# Patient Record
Sex: Female | Born: 1975 | Race: Black or African American | Hispanic: No | Marital: Single | Smoking: Never smoker
Health system: Southern US, Community
[De-identification: ages and names within clinical notes are randomized; demographics above are authoritative.]

## PROBLEM LIST (undated history)

## (undated) DIAGNOSIS — E669 Obesity, unspecified: Secondary | ICD-10-CM

## (undated) DIAGNOSIS — K649 Unspecified hemorrhoids: Secondary | ICD-10-CM

## (undated) DIAGNOSIS — I10 Essential (primary) hypertension: Secondary | ICD-10-CM

## (undated) DIAGNOSIS — Z803 Family history of malignant neoplasm of breast: Secondary | ICD-10-CM

## (undated) DIAGNOSIS — R87629 Unspecified abnormal cytological findings in specimens from vagina: Secondary | ICD-10-CM

## (undated) DIAGNOSIS — N83209 Unspecified ovarian cyst, unspecified side: Secondary | ICD-10-CM

## (undated) HISTORY — DX: Family history of malignant neoplasm of breast: Z80.3

## (undated) HISTORY — DX: Unspecified ovarian cyst, unspecified side: N83.209

## (undated) HISTORY — DX: Essential (primary) hypertension: I10

## (undated) HISTORY — PX: WRIST SURGERY: SHX841

## (undated) HISTORY — DX: Obesity, unspecified: E66.9

## (undated) HISTORY — DX: Unspecified hemorrhoids: K64.9

## (undated) HISTORY — DX: Unspecified abnormal cytological findings in specimens from vagina: R87.629

---

## 2000-06-28 ENCOUNTER — Emergency Department (HOSPITAL_COMMUNITY): Admission: EM | Admit: 2000-06-28 | Discharge: 2000-06-28 | Payer: Self-pay | Admitting: Emergency Medicine

## 2000-06-28 ENCOUNTER — Encounter: Payer: Self-pay | Admitting: Emergency Medicine

## 2000-10-02 ENCOUNTER — Ambulatory Visit (HOSPITAL_BASED_OUTPATIENT_CLINIC_OR_DEPARTMENT_OTHER): Admission: RE | Admit: 2000-10-02 | Discharge: 2000-10-02 | Payer: Self-pay | Admitting: Orthopedic Surgery

## 2001-01-31 ENCOUNTER — Encounter: Payer: Self-pay | Admitting: Orthopedic Surgery

## 2001-01-31 ENCOUNTER — Ambulatory Visit (HOSPITAL_COMMUNITY): Admission: RE | Admit: 2001-01-31 | Discharge: 2001-01-31 | Payer: Self-pay | Admitting: Orthopedic Surgery

## 2001-02-25 ENCOUNTER — Ambulatory Visit (HOSPITAL_BASED_OUTPATIENT_CLINIC_OR_DEPARTMENT_OTHER): Admission: RE | Admit: 2001-02-25 | Discharge: 2001-02-25 | Payer: Self-pay | Admitting: Orthopedic Surgery

## 2010-09-11 ENCOUNTER — Encounter: Payer: Self-pay | Admitting: Maternal & Fetal Medicine

## 2010-10-09 ENCOUNTER — Encounter: Payer: Self-pay | Admitting: Maternal and Fetal Medicine

## 2010-10-12 ENCOUNTER — Observation Stay: Payer: Self-pay | Admitting: Obstetrics and Gynecology

## 2010-10-16 ENCOUNTER — Encounter: Payer: Self-pay | Admitting: Maternal and Fetal Medicine

## 2010-10-19 ENCOUNTER — Observation Stay: Payer: Self-pay | Admitting: Obstetrics and Gynecology

## 2010-10-26 ENCOUNTER — Observation Stay: Payer: Self-pay | Admitting: Obstetrics and Gynecology

## 2010-11-02 ENCOUNTER — Observation Stay: Payer: Self-pay | Admitting: Obstetrics and Gynecology

## 2010-11-06 ENCOUNTER — Encounter: Payer: Self-pay | Admitting: Maternal and Fetal Medicine

## 2010-11-09 ENCOUNTER — Observation Stay: Payer: Self-pay | Admitting: Obstetrics and Gynecology

## 2010-11-13 ENCOUNTER — Encounter: Payer: Self-pay | Admitting: Obstetrics and Gynecology

## 2010-11-16 ENCOUNTER — Observation Stay: Payer: Self-pay | Admitting: Obstetrics and Gynecology

## 2010-11-20 ENCOUNTER — Encounter: Payer: Self-pay | Admitting: Obstetrics & Gynecology

## 2010-11-23 ENCOUNTER — Observation Stay: Payer: Self-pay | Admitting: Obstetrics and Gynecology

## 2010-11-26 ENCOUNTER — Inpatient Hospital Stay: Payer: Self-pay | Admitting: Obstetrics and Gynecology

## 2011-10-16 HISTORY — PX: COLPOSCOPY W/ BIOPSY / CURETTAGE: SUR283

## 2012-01-23 ENCOUNTER — Ambulatory Visit: Payer: Self-pay | Admitting: Family Medicine

## 2012-02-16 ENCOUNTER — Ambulatory Visit: Payer: Self-pay | Admitting: Family Medicine

## 2012-10-08 ENCOUNTER — Ambulatory Visit: Payer: Self-pay | Admitting: Obstetrics and Gynecology

## 2013-04-08 LAB — HM PAP SMEAR: HM Pap smear: NEGATIVE

## 2014-10-19 ENCOUNTER — Encounter: Payer: Self-pay | Admitting: Obstetrics and Gynecology

## 2014-11-25 ENCOUNTER — Encounter: Payer: Self-pay | Admitting: Obstetrics and Gynecology

## 2015-01-06 ENCOUNTER — Ambulatory Visit (INDEPENDENT_AMBULATORY_CARE_PROVIDER_SITE_OTHER): Payer: 59 | Admitting: Obstetrics and Gynecology

## 2015-01-06 ENCOUNTER — Encounter: Payer: Self-pay | Admitting: Obstetrics and Gynecology

## 2015-01-06 VITALS — BP 139/83 | HR 89 | Ht 67.0 in | Wt 398.6 lb

## 2015-01-06 DIAGNOSIS — Z01419 Encounter for gynecological examination (general) (routine) without abnormal findings: Secondary | ICD-10-CM

## 2015-01-06 DIAGNOSIS — Z6841 Body Mass Index (BMI) 40.0 and over, adult: Secondary | ICD-10-CM | POA: Diagnosis not present

## 2015-01-06 DIAGNOSIS — I10 Essential (primary) hypertension: Secondary | ICD-10-CM | POA: Insufficient documentation

## 2015-01-06 DIAGNOSIS — Z0000 Encounter for general adult medical examination without abnormal findings: Secondary | ICD-10-CM

## 2015-01-06 NOTE — Patient Instructions (Signed)
1.  No Pap smear this year. 2.  Mirena IUD for contraception; we'll need to replace next year. 3.  Strongly encouraged healthy eating, exercise, and weight loss. 4.  Return in 1 year for annual exam.

## 2015-01-06 NOTE — Progress Notes (Signed)
Patient ID: Charlotte PeruBelinda K Carano, female   DOB: 09-Mar-1975, 39 y.o.   MRN: [id number] ANNUAL PREVENTATIVE CARE GYN  ENCOUNTER NOTE  Subjective:       Charlotte Burgess is a 39 y.o. 571P1001 female here for a routine annual gynecologic exam.  Current complaints: 1.  none    Gynecologic History Patient's last menstrual period was 12/10/2014 (exact date). Contraception: IUD ; replaced in 2017 Last Pap: 03/2013 neg/neg. Last mammogram: n/a.  Obstetric History OB History  Gravida Para Term Preterm AB SAB TAB Ectopic Multiple Living  1 1 1       1     # Outcome Date GA Lbr Len/2nd Weight Sex Delivery Anes PTL Lv  1 Term      Vag-Spont   Y     Complications: Chronic hypertension,Obesity      Past Medical History  Diagnosis Date  . Hemorrhoids   . Vaginal Pap smear, abnormal     ascus/pos  . Hypertension     chronic  . Obesity   . Ovarian cyst   . Family history of breast cancer     Past Surgical History  Procedure Laterality Date  . Colposcopy w/ biopsy / curettage  10/2011    ecc- neg cin 1 on bx  . Wrist surgery Left     Current Outpatient Prescriptions on File Prior to Visit  Medication Sig Dispense Refill  . hydrochlorothiazide (HYDRODIURIL) 25 MG tablet Take 25 mg by mouth daily.    Marland Kitchen. levonorgestrel (MIRENA) 20 MCG/24HR IUD 1 each by Intrauterine route once.     No current facility-administered medications on file prior to visit.    No Known Allergies  Social History   Social History  . Marital Status: Single    Spouse Name: N/A  . Number of Children: N/A  . Years of Education: N/A   Occupational History  . Not on file.   Social History Main Topics  . Smoking status: Never Smoker   . Smokeless tobacco: Not on file  . Alcohol Use: Yes     Comment: occas  . Drug Use: No  . Sexual Activity: Yes    Birth Control/ Protection: IUD   Other Topics Concern  . Not on file   Social History Narrative    Family History  Problem Relation Age of Onset  . Heart  disease Mother   . Heart disease Father   . Diabetes Father   . Colon cancer Maternal Aunt   . Ovarian cancer Maternal Aunt   . Breast cancer Maternal Aunt   . Breast cancer Maternal Grandmother     The following portions of the patient's history were reviewed and updated as appropriate: allergies, current medications, past family history, past medical history, past social history, past surgical history and problem list.  Review of Systems ROS Review of Systems - General ROS: negative for - chills, fatigue, fever, hot flashes, night sweats, weight gain or weight loss Psychological ROS: negative for - anxiety, decreased libido, depression, mood swings, physical abuse or sexual abuse Ophthalmic ROS: negative for - blurry vision, eye pain or loss of vision ENT ROS: negative for - headaches, hearing change, visual changes or vocal changes Allergy and Immunology ROS: negative for - hives, itchy/watery eyes or seasonal allergies Hematological and Lymphatic ROS: negative for - bleeding problems, bruising, swollen lymph nodes or weight loss Endocrine ROS: negative for - galactorrhea, hair pattern changes, hot flashes, malaise/lethargy, mood swings, palpitations, polydipsia/polyuria, skin changes, temperature intolerance or unexpected  weight changes Breast ROS: negative for - new or changing breast lumps or nipple discharge Respiratory ROS: negative for - cough or shortness of breath Cardiovascular ROS: negative for - chest pain, irregular heartbeat, palpitations or shortness of breath Gastrointestinal ROS: no abdominal pain, change in bowel habits, or black or bloody stools Genito-Urinary ROS: no dysuria, trouble voiding, or hematuria Musculoskeletal ROS: negative for - joint pain or joint stiffness Neurological ROS: negative for - bowel and bladder control changes Dermatological ROS: negative for rash and skin lesion changes   Objective:   BP 139/83 mmHg  Pulse 89  Ht  (1.702 m)  Wt  398 lb 9.6 oz (180.804 kg)  BMI 62.41 kg/m2  LMP 12/10/2014 (Exact Date) CONSTITUTIONAL: Well-developed, well-nourished female in no acute distress.  PSYCHIATRIC: Normal mood and affect. Normal behavior. Normal judgment and thought content. NEUROLGIC: Alert and oriented to person, place, and time. Normal muscle tone coordination. No cranial nerve deficit noted. HENT:  Normocephalic, atraumatic, External right and left ear normal. Oropharynx is clear and moist EYES: Conjunctivae and EOM are normal. Pupils are equal, round, and reactive to light. No scleral icterus.  NECK: Normal range of motion, supple, no masses.  Normal thyroid.  SKIN: Skin is warm and dry. No rash noted. Not diaphoretic. No erythema. No pallor. CARDIOVASCULAR: Normal heart rate noted, regular rhythm, no murmur. RESPIRATORY: Clear to auscultation bilaterally. Effort and breath sounds normal, no problems with respiration noted. BREASTS: Symmetric in size. No masses, skin changes, nipple drainage, or lymphadenopathy. ABDOMEN: Soft, normal bowel sounds, no distention noted.  No tenderness, rebound or guarding.  BLADDER: Normal PELVIC:  External Genitalia: Normal  BUS: Normal  Vagina: Normal  Cervix: Normal; IUD string 3 cm  Uterus: Normal; nonenlarged  Adnexa: Normal  RV: External Exam NormaI  MUSCULOSKELETAL: Normal range of motion. No tenderness.  No cyanosis, clubbing, or edema.  2+ distal pulses. LYMPHATIC: No Axillary, Supraclavicular, or Inguinal Adenopathy.    Assessment:   Annual gynecologic examination 39 y.o. Contraception: IUD [id number] Chronic hypertension, stable, managed by Dr. Burnadette Pop  Plan:  Pap: Not needed Mammogram: Not Indicated Stool Guaiac Testing:  Not Indicated Labs: thru pcp Routine preventative health maintenance measures emphasized: Exercise/Diet/Weight control, Tobacco Warnings, Alcohol/Substance use risks and Safe Sex Return to Clinic - 1 Year   Crystal [place], [place]  Herold Harms, MD  Note: This dictation was prepared with Dragon dictation along with smaller phrase technology. Any transcriptional errors that result from this process are unintentional.

## 2016-01-10 ENCOUNTER — Encounter: Payer: 59 | Admitting: Obstetrics and Gynecology

## 2016-01-11 ENCOUNTER — Encounter: Payer: 59 | Admitting: Obstetrics and Gynecology

## 2016-01-12 NOTE — Progress Notes (Signed)
Patient ID: Charlotte PeruBelinda K Joswick, female   DOB: 1975-11-18, 40 y.o.   MRN: [id number] ANNUAL PREVENTATIVE CARE GYN  ENCOUNTER NOTE  Subjective:       Charlotte Burgess is a 40 y.o. 31P1001 female here for a routine annual gynecologic exam.  Current complaints: 1.  REMOVE MIRENA  Patient has lost 14 pounds in the past year. Cycles are light. Bowel function is normal. Bladder function is normal. Screening labs are obtained through primary care.   Gynecologic History No LMP recorded. Contraception: IUD ; replaced in 2017 Last Pap: 03/2013 neg/neg. Last mammogram: n/a.  Obstetric History OB History  Gravida Para Term Preterm AB Living  1 1 1     1   SAB TAB Ectopic Multiple Live Births          1    # Outcome Date GA Lbr Len/2nd Weight Sex Delivery Anes PTL Lv  1 Term      Vag-Spont   LIV     Complications: Chronic hypertension,Obesity      Past Medical History:  Diagnosis Date  . Family history of breast cancer   . Hemorrhoids   . Hypertension    chronic  . Obesity   . Ovarian cyst   . Vaginal Pap smear, abnormal    ascus/pos    Past Surgical History:  Procedure Laterality Date  . COLPOSCOPY W/ BIOPSY / CURETTAGE  10/2011   ecc- neg cin 1 on bx  . WRIST SURGERY Left     Current Outpatient Prescriptions on File Prior to Visit  Medication Sig Dispense Refill  . hydrochlorothiazide (HYDRODIURIL) 25 MG tablet Take 25 mg by mouth daily.    Marland Kitchen. levonorgestrel (MIRENA) 20 MCG/24HR IUD 1 each by Intrauterine route once.    Marland Kitchen. losartan (COZAAR) 50 MG tablet Take by mouth.    . Multiple Vitamin (MULTI-VITAMINS) TABS Take by mouth.     No current facility-administered medications on file prior to visit.     No Known Allergies  Social History   Social History  . Marital status: Single    Spouse name: N/A  . Number of children: N/A  . Years of education: N/A   Occupational History  . Not on file.   Social History Main Topics  . Smoking status: Never Smoker  .  Smokeless tobacco: Not on file  . Alcohol use Yes     Comment: occas  . Drug use: No  . Sexual activity: Yes    Birth control/ protection: IUD   Other Topics Concern  . Not on file   Social History Narrative  . No narrative on file    Family History  Problem Relation Age of Onset  . Heart disease Mother   . Heart disease Father   . Diabetes Father   . Colon cancer Maternal Aunt   . Ovarian cancer Maternal Aunt   . Breast cancer Maternal Aunt   . Breast cancer Maternal Grandmother     The following portions of the patient's history were reviewed and updated as appropriate: allergies, current medications, past family history, past medical history, past social history, past surgical history and problem list.  Review of Systems ROS Review of Systems - General ROS: negative for - chills, fatigue, fever, hot flashes, night sweats, weight gain or weight loss Psychological ROS: negative for - anxiety, decreased libido, depression, mood swings, physical abuse or sexual abuse Ophthalmic ROS: negative for - blurry vision, eye pain or loss of vision ENT ROS: negative for -  headaches, hearing change, visual changes or vocal changes Allergy and Immunology ROS: negative for - hives, itchy/watery eyes or seasonal allergies Hematological and Lymphatic ROS: negative for - bleeding problems, bruising, swollen lymph nodes or weight loss Endocrine ROS: negative for - galactorrhea, hair pattern changes, hot flashes, malaise/lethargy, mood swings, palpitations, polydipsia/polyuria, skin changes, temperature intolerance or unexpected weight changes Breast ROS: negative for - new or changing breast lumps or nipple discharge Respiratory ROS: negative for - cough or shortness of breath Cardiovascular ROS: negative for - chest pain, irregular heartbeat, palpitations or shortness of breath Gastrointestinal ROS: no abdominal pain, change in bowel habits, or black or bloody stools Genito-Urinary ROS: no  dysuria, trouble voiding, or hematuria Musculoskeletal ROS: negative for - joint pain or joint stiffness Neurological ROS: negative for - bowel and bladder control changes Dermatological ROS: negative for rash and skin lesion changes   Objective:    BP (!) 141/89   Pulse 90   Ht 5\' 7"  (1.702 m)   Wt (!) 384 lb 8 oz (174.4 kg)   LMP 01/04/2016 (Approximate)   BMI 60.22 kg/m   CONSTITUTIONAL: Well-developed, well-nourished female in no acute distress.  PSYCHIATRIC: Normal mood and affect. Normal behavior. Normal judgment and thought content. NEUROLGIC: Alert and oriented to person, place, and time. Normal muscle tone coordination. No cranial nerve deficit noted. HENT:  Normocephalic, atraumatic, External right and left ear normal. Oropharynx is clear and moist EYES: Conjunctivae and EOM are normal. Pupils are equal, round, and reactive to light. No scleral icterus.  NECK: Normal range of motion, supple, no masses.  Normal thyroid.  SKIN: Skin is warm and dry. No rash noted. Not diaphoretic. No erythema. No pallor. CARDIOVASCULAR: Normal heart rate noted, regular rhythm, no murmur. RESPIRATORY: Clear to auscultation bilaterally. Effort and breath sounds normal, no problems with respiration noted. BREASTS: Symmetric in size. No masses, skin changes, nipple drainage, or lymphadenopathy. ABDOMEN: Soft, normal bowel sounds, no distention noted.  No tenderness, rebound or guarding.  BLADDER: Normal PELVIC:  External Genitalia: Normal  BUS: Normal  Vagina: Normal  Cervix: Normal; IUD string 3 cm, removed with [place] forceps today  Uterus: Normal; nonenlarged; midplane; nontender  Adnexa: Normal; nonpalpable and nontender  RV: External Exam NormaI  MUSCULOSKELETAL: Normal range of motion. No tenderness.  No cyanosis, clubbing, or edema.  2+ distal pulses. LYMPHATIC: No Axillary, Supraclavicular, or Inguinal Adenopathy.  PROCEDURE: IUD removal Consent is obtained. IUD string is grasped  with [place] forceps and IUD is removed. No complications  Assessment:   Annual gynecologic examination 40 y.o. Contraception: IUD BMI- 60 Chronic hypertension, stable, managed by Dr. Burnadette PopLinthavong  Plan:  Pap: PAP/HPV Mammogram: due age 40- ORDERED Stool Guaiac Testing:  Not Indicated Labs: thru pcp Routine preventative health maintenance measures emphasized: Exercise/Diet/Weight control, Tobacco Warnings, Alcohol/Substance use risks and Safe Sex  IUD is removed Return to Clinic - 1 Year   Marlise Fahr BasinMiller, CMA  Herold HarmsMartin A Defrancesco, MD    Note: This dictation was prepared with Dragon dictation along with smaller phrase technology. Any transcriptional errors that result from this process are unintentional.

## 2016-01-18 ENCOUNTER — Encounter: Payer: Self-pay | Admitting: Obstetrics and Gynecology

## 2016-01-18 ENCOUNTER — Ambulatory Visit (INDEPENDENT_AMBULATORY_CARE_PROVIDER_SITE_OTHER): Payer: 59 | Admitting: Obstetrics and Gynecology

## 2016-01-18 VITALS — BP 141/89 | HR 90 | Ht 67.0 in | Wt 384.5 lb

## 2016-01-18 DIAGNOSIS — Z6841 Body Mass Index (BMI) 40.0 and over, adult: Secondary | ICD-10-CM | POA: Diagnosis not present

## 2016-01-18 DIAGNOSIS — Z01419 Encounter for gynecological examination (general) (routine) without abnormal findings: Secondary | ICD-10-CM | POA: Diagnosis not present

## 2016-01-18 DIAGNOSIS — Z1239 Encounter for other screening for malignant neoplasm of breast: Secondary | ICD-10-CM

## 2016-01-18 DIAGNOSIS — Z30432 Encounter for removal of intrauterine contraceptive device: Secondary | ICD-10-CM

## 2016-01-18 DIAGNOSIS — Z1231 Encounter for screening mammogram for malignant neoplasm of breast: Secondary | ICD-10-CM

## 2016-01-18 NOTE — Patient Instructions (Signed)
1. Pap smear is done 2. Mammogram is ordered 3. Continue with healthy eating and exercise with controlled weight loss 4. Screening labs are to be done by primary care 5. Return in 1 year   Health Maintenance, Female Introduction Adopting a healthy lifestyle and getting preventive care can go a long way to promote health and wellness. Talk with your health care provider about what schedule of regular examinations is right for you. This is a good chance for you to check in with your provider about disease prevention and staying healthy. In between checkups, there are plenty of things you can do on your own. Experts have done a lot of research about which lifestyle changes and preventive measures are most likely to keep you healthy. Ask your health care provider for more information. Weight and diet Eat a healthy diet  Be sure to include plenty of vegetables, fruits, low-fat dairy products, and lean protein.  Do not eat a lot of foods high in solid fats, added sugars, or salt.  Get regular exercise. This is one of the most important things you can do for your health.  Most adults should exercise for at least 150 minutes each week. The exercise should increase your heart rate and make you sweat (moderate-intensity exercise).  Most adults should also do strengthening exercises at least twice a week. This is in addition to the moderate-intensity exercise. Maintain a healthy weight  Body mass index (BMI) is a measurement that can be used to identify possible weight problems. It estimates body fat based on height and weight. Your health care provider can help determine your BMI and help you achieve or maintain a healthy weight.  For females 76 years of age and older:  A BMI below 18.5 is considered underweight.  A BMI of 18.5 to 24.9 is normal.  A BMI of 25 to 29.9 is considered overweight.  A BMI of 30 and above is considered obese. Watch levels of cholesterol and blood lipids  You  should start having your blood tested for lipids and cholesterol at 40 years of age, then have this test every 5 years.  You may need to have your cholesterol levels checked more often if:  Your lipid or cholesterol levels are high.  You are older than 40 years of age.  You are at high risk for heart disease. Cancer screening Lung Cancer  Lung cancer screening is recommended for adults 47-31 years old who are at high risk for lung cancer because of a history of smoking.  A yearly low-dose CT scan of the lungs is recommended for people who:  Currently smoke.  Have quit within the past 15 years.  Have at least a 30-pack-year history of smoking. A pack year is smoking an average of one pack of cigarettes a day for 1 year.  Yearly screening should continue until it has been 15 years since you quit.  Yearly screening should stop if you develop a health problem that would prevent you from having lung cancer treatment. Breast Cancer  Practice breast self-awareness. This means understanding how your breasts normally appear and feel.  It also means doing regular breast self-exams. Let your health care provider know about any changes, no matter how small.  If you are in your 20s or 30s, you should have a clinical breast exam (CBE) by a health care provider every 1-3 years as part of a regular health exam.  If you are 70 or older, have a CBE every year. Also  consider having a breast X-ray (mammogram) every year.  If you have a family history of breast cancer, talk to your health care provider about genetic screening.  If you are at high risk for breast cancer, talk to your health care provider about having an MRI and a mammogram every year.  Breast cancer gene (BRCA) assessment is recommended for women who have family members with BRCA-related cancers. BRCA-related cancers include:  Breast.  Ovarian.  Tubal.  Peritoneal cancers.  Results of the assessment will determine the need  for genetic counseling and BRCA1 and BRCA2 testing. Cervical Cancer  Your health care provider may recommend that you be screened regularly for cancer of the pelvic organs (ovaries, uterus, and vagina). This screening involves a pelvic examination, including checking for microscopic changes to the surface of your cervix (Pap test). You may be encouraged to have this screening done every 3 years, beginning at age 47.  For women ages 33-65, health care providers may recommend pelvic exams and Pap testing every 3 years, or they may recommend the Pap and pelvic exam, combined with testing for human papilloma virus (HPV), every 5 years. Some types of HPV increase your risk of cervical cancer. Testing for HPV may also be done on women of any age with unclear Pap test results.  Other health care providers may not recommend any screening for nonpregnant women who are considered low risk for pelvic cancer and who do not have symptoms. Ask your health care provider if a screening pelvic exam is right for you.  If you have had past treatment for cervical cancer or a condition that could lead to cancer, you need Pap tests and screening for cancer for at least 20 years after your treatment. If Pap tests have been discontinued, your risk factors (such as having a new sexual partner) need to be reassessed to determine if screening should resume. Some women have medical problems that increase the chance of getting cervical cancer. In these cases, your health care provider may recommend more frequent screening and Pap tests. Colorectal Cancer  This type of cancer can be detected and often prevented.  Routine colorectal cancer screening usually begins at 40 years of age and continues through 40 years of age.  Your health care provider may recommend screening at an earlier age if you have risk factors for colon cancer.  Your health care provider may also recommend using home test kits to check for hidden blood in the  stool.  A small camera at the end of a tube can be used to examine your colon directly (sigmoidoscopy or colonoscopy). This is done to check for the earliest forms of colorectal cancer.  Routine screening usually begins at age 43.  Direct examination of the colon should be repeated every 5-10 years through 40 years of age. However, you may need to be screened more often if early forms of precancerous polyps or small growths are found. Skin Cancer  Check your skin from head to toe regularly.  Tell your health care provider about any new moles or changes in moles, especially if there is a change in a mole's shape or color.  Also tell your health care provider if you have a mole that is larger than the size of a pencil eraser.  Always use sunscreen. Apply sunscreen liberally and repeatedly throughout the day.  Protect yourself by wearing long sleeves, pants, a wide-brimmed hat, and sunglasses whenever you are outside. Heart disease, diabetes, and high blood pressure  High  blood pressure causes heart disease and increases the risk of stroke. High blood pressure is more likely to develop in:  People who have blood pressure in the high end of the normal range (130-139/85-89 mm Hg).  People who are overweight or obese.  People who are African American.  If you are 74-57 years of age, have your blood pressure checked every 3-5 years. If you are 50 years of age or older, have your blood pressure checked every year. You should have your blood pressure measured twice-once when you are at a hospital or clinic, and once when you are not at a hospital or clinic. Record the average of the two measurements. To check your blood pressure when you are not at a hospital or clinic, you can use:  An automated blood pressure machine at a pharmacy.  A home blood pressure monitor.  If you are between 68 years and 71 years old, ask your health care provider if you should take aspirin to prevent  strokes.  Have regular diabetes screenings. This involves taking a blood sample to check your fasting blood sugar level.  If you are at a normal weight and have a low risk for diabetes, have this test once every three years after 40 years of age.  If you are overweight and have a high risk for diabetes, consider being tested at a younger age or more often. Preventing infection Hepatitis B  If you have a higher risk for hepatitis B, you should be screened for this virus. You are considered at high risk for hepatitis B if:  You were born in a country where hepatitis B is common. Ask your health care provider which countries are considered high risk.  Your parents were born in a high-risk country, and you have not been immunized against hepatitis B (hepatitis B vaccine).  You have HIV or AIDS.  You use needles to inject street drugs.  You live with someone who has hepatitis B.  You have had sex with someone who has hepatitis B.  You get hemodialysis treatment.  You take certain medicines for conditions, including cancer, organ transplantation, and autoimmune conditions. Hepatitis C  Blood testing is recommended for:  Everyone born from [birth year] through 1964.  Anyone with known risk factors for hepatitis C. Sexually transmitted infections (STIs)  You should be screened for sexually transmitted infections (STIs) including gonorrhea and chlamydia if:  You are sexually active and are younger than 40 years of age.  You are older than 40 years of age and your health care provider tells you that you are at risk for this type of infection.  Your sexual activity has changed since you were last screened and you are at an increased risk for chlamydia or gonorrhea. Ask your health care provider if you are at risk.  If you do not have HIV, but are at risk, it may be recommended that you take a prescription medicine daily to prevent HIV infection. This is called pre-exposure prophylaxis  (PrEP). You are considered at risk if:  You are sexually active and do not regularly use condoms or know the HIV status of your partner(s).  You take drugs by injection.  You are sexually active with a partner who has HIV. Talk with your health care provider about whether you are at high risk of being infected with HIV. If you choose to begin PrEP, you should first be tested for HIV. You should then be tested every 3 months for as long as  you are taking PrEP. Pregnancy  If you are premenopausal and you may become pregnant, ask your health care provider about preconception counseling.  If you may become pregnant, take 400 to 800 micrograms (mcg) of folic acid every day.  If you want to prevent pregnancy, talk to your health care provider about birth control (contraception). Osteoporosis and menopause  Osteoporosis is a disease in which the bones lose minerals and strength with aging. This can result in serious bone fractures. Your risk for osteoporosis can be identified using a bone density scan.  If you are 65 years of age or older, or if you are at risk for osteoporosis and fractures, ask your health care provider if you should be screened.  Ask your health care provider whether you should take a calcium or vitamin D supplement to lower your risk for osteoporosis.  Menopause may have certain physical symptoms and risks.  Hormone replacement therapy may reduce some of these symptoms and risks. Talk to your health care provider about whether hormone replacement therapy is right for you. Follow these instructions at home:  Schedule regular health, dental, and eye exams.  Stay current with your immunizations.  Do not use any tobacco products including cigarettes, chewing tobacco, or electronic cigarettes.  If you are pregnant, do not drink alcohol.  If you are breastfeeding, limit how much and how often you drink alcohol.  Limit alcohol intake to no more than 1 drink per day for  nonpregnant women. One drink equals 12 ounces of beer, 5 ounces of wine, or 1 ounces of hard liquor.  Do not use street drugs.  Do not share needles.  Ask your health care provider for help if you need support or information about quitting drugs.  Tell your health care provider if you often feel depressed.  Tell your health care provider if you have ever been abused or do not feel safe at home. This information is not intended to replace advice given to you by your health care provider. Make sure you discuss any questions you have with your health care provider. Document Released: 07/17/2010 Document Revised: 06/09/2015 Document Reviewed: 10/05/2014  2017 [employer]vier

## 2016-01-20 LAB — PAP IG AND HPV HIGH-RISK
HPV, high-risk: NEGATIVE
PAP Smear Comment: 0

## 2016-03-09 ENCOUNTER — Ambulatory Visit
Admission: RE | Admit: 2016-03-09 | Discharge: 2016-03-09 | Disposition: A | Discharge status: Home / Self Care | Payer: 59 | Source: Ambulatory Visit | Attending: Obstetrics and Gynecology | Admitting: Obstetrics and Gynecology

## 2016-03-09 DIAGNOSIS — Z1231 Encounter for screening mammogram for malignant neoplasm of breast: Secondary | ICD-10-CM | POA: Diagnosis present

## 2016-03-09 DIAGNOSIS — Z1239 Encounter for other screening for malignant neoplasm of breast: Secondary | ICD-10-CM

## 2016-03-19 DIAGNOSIS — Z0000 Encounter for general adult medical examination without abnormal findings: Secondary | ICD-10-CM | POA: Diagnosis not present

## 2016-03-26 DIAGNOSIS — I10 Essential (primary) hypertension: Secondary | ICD-10-CM | POA: Diagnosis not present

## 2016-03-26 DIAGNOSIS — Z0000 Encounter for general adult medical examination without abnormal findings: Secondary | ICD-10-CM | POA: Diagnosis not present

## 2016-09-25 DIAGNOSIS — I10 Essential (primary) hypertension: Secondary | ICD-10-CM | POA: Diagnosis not present

## 2016-09-27 DIAGNOSIS — I10 Essential (primary) hypertension: Secondary | ICD-10-CM | POA: Diagnosis not present

## 2017-01-21 NOTE — Progress Notes (Signed)
Patient ID: Charlotte Burgess, female   DOB: 1975/01/17, 42 y.o.   MRN: [id number] ANNUAL PREVENTATIVE CARE GYN  ENCOUNTER NOTE  Subjective:       Charlotte PeruBelinda K Gruenhagen is a 42 y.o. 521P1001 female here for a routine annual gynecologic exam.  Current complaints: 1.  none   Cycles are regular- med flow. Bowel function is normal. Bladder function is normal. Screening labs are obtained through primary care. Patient notes inframammary rash, pruritic Patient is not sexually active with intercourse   Gynecologic History LMP- 12/18/2016 Contraception: none Last Pap: 01/18/2016 neg/neg. Last mammogram: 03/09/2016 birad 1  Obstetric History OB History  Gravida Para Term Preterm AB Living  1 1 1     1   SAB TAB Ectopic Multiple Live Births          1    # Outcome Date GA Lbr Len/2nd Weight Sex Delivery Anes PTL Lv  1 Term      Vag-Spont   LIV     Complications: Chronic hypertension,Obesity      Past Medical History:  Diagnosis Date  . Family history of breast cancer   . Hemorrhoids   . Hypertension    chronic  . Obesity   . Ovarian cyst   . Vaginal Pap smear, abnormal    ascus/pos    Past Surgical History:  Procedure Laterality Date  . COLPOSCOPY W/ BIOPSY / CURETTAGE  10/2011   ecc- neg cin 1 on bx  . WRIST SURGERY Left     Current Outpatient Medications on File Prior to Visit  Medication Sig Dispense Refill  . hydrochlorothiazide (HYDRODIURIL) 25 MG tablet Take 25 mg by mouth daily.    Marland Kitchen. levonorgestrel (MIRENA) 20 MCG/24HR IUD 1 each by Intrauterine route once.    Marland Kitchen. losartan (COZAAR) 50 MG tablet Take by mouth.    . Multiple Vitamin (MULTI-VITAMINS) TABS Take by mouth.     No current facility-administered medications on file prior to visit.     No Known Allergies  Social History   Socioeconomic History  . Marital status: Single    Spouse name: Not on file  . Number of children: Not on file  . Years of education: Not on file  . Highest education level: Not on file   Social Needs  . Financial resource strain: Not on file  . Food insecurity - worry: Not on file  . Food insecurity - inability: Not on file  . Transportation needs - medical: Not on file  . Transportation needs - non-medical: Not on file  Occupational History  . Not on file  Tobacco Use  . Smoking status: Never Smoker  . Smokeless tobacco: Never Used  Substance and Sexual Activity  . Alcohol use: Yes    Comment: occas  . Drug use: No  . Sexual activity: Yes    Birth control/protection: IUD  Other Topics Concern  . Not on file  Social History Narrative  . Not on file    Family History  Problem Relation Age of Onset  . Heart disease Mother   . Heart disease Father   . Diabetes Father   . Colon cancer Maternal Aunt   . Ovarian cancer Maternal Aunt   . Breast cancer Maternal Aunt   . Breast cancer Maternal Grandmother   . Breast cancer Maternal Aunt        not 100% sure    The following portions of the patient's history were reviewed and updated as appropriate: allergies, current medications,  past family history, past medical history, past social history, past surgical history and problem list.  Review of Systems Review of Systems  Constitutional: Negative.   HENT: Negative.   Eyes: Negative.   Respiratory: Negative.   Cardiovascular: Negative.   Gastrointestinal: Negative.   Genitourinary: Negative.   Musculoskeletal: Negative.   Skin: Positive for rash.       Inframammary rash consistent with monilia  Neurological: Negative.   Endo/Heme/Allergies: Negative.   Psychiatric/Behavioral: Negative.      Objective:    BP (!) 142/86   Pulse 87   Ht 5\' 7"  (1.702 m)   Wt (!) 398 lb 3.2 oz (180.6 kg)   LMP 12/18/2016 (Exact Date)   BMI 62.37 kg/m   CONSTITUTIONAL: Well-developed, well-nourished female in no acute distress.  PSYCHIATRIC: Normal mood and affect. Normal behavior. Normal judgment and thought content. NEUROLGIC: Alert and oriented to person, place,  and time. Normal muscle tone coordination. No cranial nerve deficit noted. HENT:  Normocephalic, atraumatic, External right and left ear normal. Oropharynx is clear and moist EYES: Conjunctivae and EOM are normal. No scleral icterus.  NECK: Normal range of motion, supple, no masses.  Normal thyroid.  SKIN: Skin is warm and dry. No rash noted. Not diaphoretic. No erythema. No pallor. CARDIOVASCULAR: Normal heart rate noted, regular rhythm, no murmur. RESPIRATORY: Clear to auscultation bilaterally. Effort and breath sounds normal, no problems with respiration noted. BREASTS: Symmetric in size. No masses, skin changes, nipple drainage, or lymphadenopathy.  Monilia type inframammary skin rashes noted bilaterally ABDOMEN: Soft, normal bowel sounds, no distention noted.  No tenderness, rebound or guarding.  BLADDER: Normal PELVIC: (Graves speculum-large)  External Genitalia: Normal  BUS: Normal  Vagina: Normal  Cervix: Normal; no lesions; normal cervical mucus seen  Uterus: Normal; nonenlarged; midplane; nontender (body habitus restricts ability to assess uterus)  Adnexa: Normal; nonpalpable and nontender  RV: External Exam NormaI, No Rectal Masses and Normal Sphincter tone  MUSCULOSKELETAL: Normal range of motion. No tenderness.  No cyanosis, clubbing, or edema.  2+ distal pulses. LYMPHATIC: No Axillary, Supraclavicular, or Inguinal Adenopathy.    Assessment:   Annual gynecologic examination 42 y.o. Contraception: none BMI- 62; weight increasing due to decreased physical activity Chronic hypertension, stable, managed by Dr. Burnadette Pop   Plan:  Pap: Due 2021 Mammogram:ordered Stool Guaiac Testing:  Not Indicated Labs: thru pcp Routine preventative health maintenance measures emphasized: Exercise/Diet/Weight control, Tobacco Warnings, Alcohol/Substance use risks and Safe Sex  Weight loss goal is 10-15 pounds in 1 year; encouraged 10,000 steps a day Nystatin/triamcinolone cream is to be  applied topically to the breast rash twice a day for 7-14 days Return to Clinic - 1 [address] [place], CMA  Herold Harms, MD  Note: This dictation was prepared with Dragon dictation along with smaller phrase technology. Any transcriptional errors that result from this process are unintentional.

## 2017-01-22 ENCOUNTER — Ambulatory Visit (INDEPENDENT_AMBULATORY_CARE_PROVIDER_SITE_OTHER): Payer: 59 | Admitting: Obstetrics and Gynecology

## 2017-01-22 ENCOUNTER — Encounter: Payer: Self-pay | Admitting: Obstetrics and Gynecology

## 2017-01-22 VITALS — BP 142/86 | HR 87 | Ht 67.0 in | Wt 398.2 lb

## 2017-01-22 DIAGNOSIS — Z1239 Encounter for other screening for malignant neoplasm of breast: Secondary | ICD-10-CM

## 2017-01-22 DIAGNOSIS — Z01419 Encounter for gynecological examination (general) (routine) without abnormal findings: Secondary | ICD-10-CM

## 2017-01-22 DIAGNOSIS — Z6841 Body Mass Index (BMI) 40.0 and over, adult: Secondary | ICD-10-CM

## 2017-01-22 DIAGNOSIS — Z1231 Encounter for screening mammogram for malignant neoplasm of breast: Secondary | ICD-10-CM

## 2017-01-22 DIAGNOSIS — B379 Candidiasis, unspecified: Secondary | ICD-10-CM | POA: Diagnosis not present

## 2017-01-22 MED ORDER — NYSTATIN-TRIAMCINOLONE 100000-0.1 UNIT/GM-% EX CREA: 1.0000 "application " | TOPICAL_CREAM | Freq: Two times a day (BID) | CUTANEOUS | 1 refills | Status: DC

## 2017-01-22 NOTE — Patient Instructions (Signed)
1.  No Pap smear is done.  Next Pap smear is due in 2021 2.  Mammogram is ordered 3.  Screening labs are to be obtained through primary care 4.  Continue with healthy eating and exercise with control weight loss.  Weight loss goal for the year is 15 pounds 5.  Return in 1 year for annual exam 6.  Nystatin/triamcinolone cream is to be applied topically to the rash twice a day for 10-14 days for management of his dermatitis  Health Maintenance, Female Adopting a healthy lifestyle and getting preventive care can go a long way to promote health and wellness. Talk with your health care provider about what schedule of regular examinations is right for you. This is a good chance for you to check in with your provider about disease prevention and staying healthy. In between checkups, there are plenty of things you can do on your own. Experts have done a lot of research about which lifestyle changes and preventive measures are most likely to keep you healthy. Ask your health care provider for more information. Weight and diet Eat a healthy diet  Be sure to include plenty of vegetables, fruits, low-fat dairy products, and lean protein.  Do not eat a lot of foods high in solid fats, added sugars, or salt.  Get regular exercise. This is one of the most important things you can do for your health. ? Most adults should exercise for at least 150 minutes each week. The exercise should increase your heart rate and make you sweat (moderate-intensity exercise). ? Most adults should also do strengthening exercises at least twice a week. This is in addition to the moderate-intensity exercise.  Maintain a healthy weight  Body mass index (BMI) is a measurement that can be used to identify possible weight problems. It estimates body fat based on height and weight. Your health care provider can help determine your BMI and help you achieve or maintain a healthy weight.  For females 75 years of age and older: ? A BMI  below 18.5 is considered underweight. ? A BMI of 18.5 to 24.9 is normal. ? A BMI of 25 to 29.9 is considered overweight. ? A BMI of 30 and above is considered obese.  Watch levels of cholesterol and blood lipids  You should start having your blood tested for lipids and cholesterol at 41 years of age, then have this test every 5 years.  You may need to have your cholesterol levels checked more often if: ? Your lipid or cholesterol levels are high. ? You are older than 41 years of age. ? You are at high risk for heart disease.  Cancer screening Lung Cancer  Lung cancer screening is recommended for adults 47-21 years old who are at high risk for lung cancer because of a history of smoking.  A yearly low-dose CT scan of the lungs is recommended for people who: ? Currently smoke. ? Have quit within the past 15 years. ? Have at least a 30-pack-year history of smoking. A pack year is smoking an average of one pack of cigarettes a day for 1 year.  Yearly screening should continue until it has been 15 years since you quit.  Yearly screening should stop if you develop a health problem that would prevent you from having lung cancer treatment.  Breast Cancer  Practice breast self-awareness. This means understanding how your breasts normally appear and feel.  It also means doing regular breast self-exams. Let your health care provider know  about any changes, no matter how small.  If you are in your 20s or 30s, you should have a clinical breast exam (CBE) by a health care provider every 1-3 years as part of a regular health exam.  If you are 45 or older, have a CBE every year. Also consider having a breast X-ray (mammogram) every year.  If you have a family history of breast cancer, talk to your health care provider about genetic screening.  If you are at high risk for breast cancer, talk to your health care provider about having an MRI and a mammogram every year.  Breast cancer gene  (BRCA) assessment is recommended for women who have family members with BRCA-related cancers. BRCA-related cancers include: ? Breast. ? Ovarian. ? Tubal. ? Peritoneal cancers.  Results of the assessment will determine the need for genetic counseling and BRCA1 and BRCA2 testing.  Cervical Cancer Your health care provider may recommend that you be screened regularly for cancer of the pelvic organs (ovaries, uterus, and vagina). This screening involves a pelvic examination, including checking for microscopic changes to the surface of your cervix (Pap test). You may be encouraged to have this screening done every 3 years, beginning at age 50.  For women ages 49-65, health care providers may recommend pelvic exams and Pap testing every 3 years, or they may recommend the Pap and pelvic exam, combined with testing for human papilloma virus (HPV), every 5 years. Some types of HPV increase your risk of cervical cancer. Testing for HPV may also be done on women of any age with unclear Pap test results.  Other health care providers may not recommend any screening for nonpregnant women who are considered low risk for pelvic cancer and who do not have symptoms. Ask your health care provider if a screening pelvic exam is right for you.  If you have had past treatment for cervical cancer or a condition that could lead to cancer, you need Pap tests and screening for cancer for at least 20 years after your treatment. If Pap tests have been discontinued, your risk factors (such as having a new sexual partner) need to be reassessed to determine if screening should resume. Some women have medical problems that increase the chance of getting cervical cancer. In these cases, your health care provider may recommend more frequent screening and Pap tests.  Colorectal Cancer  This type of cancer can be detected and often prevented.  Routine colorectal cancer screening usually begins at 41 years of age and continues  through 41 years of age.  Your health care provider may recommend screening at an earlier age if you have risk factors for colon cancer.  Your health care provider may also recommend using home test kits to check for hidden blood in the stool.  A small camera at the end of a tube can be used to examine your colon directly (sigmoidoscopy or colonoscopy). This is done to check for the earliest forms of colorectal cancer.  Routine screening usually begins at age 98.  Direct examination of the colon should be repeated every 5-10 years through 41 years of age. However, you may need to be screened more often if early forms of precancerous polyps or small growths are found.  Skin Cancer  Check your skin from head to toe regularly.  Tell your health care provider about any new moles or changes in moles, especially if there is a change in a mole's shape or color.  Also tell your health care  provider if you have a mole that is larger than the size of a pencil eraser.  Always use sunscreen. Apply sunscreen liberally and repeatedly throughout the day.  Protect yourself by wearing long sleeves, pants, a wide-brimmed hat, and sunglasses whenever you are outside.  Heart disease, diabetes, and high blood pressure  High blood pressure causes heart disease and increases the risk of stroke. High blood pressure is more likely to develop in: ? People who have blood pressure in the high end of the normal range (130-139/85-89 mm Hg). ? People who are overweight or obese. ? People who are African American.  If you are 42-44 years of age, have your blood pressure checked every 3-5 years. If you are 45 years of age or older, have your blood pressure checked every year. You should have your blood pressure measured twice-once when you are at a hospital or clinic, and once when you are not at a hospital or clinic. Record the average of the two measurements. To check your blood pressure when you are not at a  hospital or clinic, you can use: ? An automated blood pressure machine at a pharmacy. ? A home blood pressure monitor.  If you are between 59 years and 65 years old, ask your health care provider if you should take aspirin to prevent strokes.  Have regular diabetes screenings. This involves taking a blood sample to check your fasting blood sugar level. ? If you are at a normal weight and have a low risk for diabetes, have this test once every three years after 41 years of age. ? If you are overweight and have a high risk for diabetes, consider being tested at a younger age or more often. Preventing infection Hepatitis B  If you have a higher risk for hepatitis B, you should be screened for this virus. You are considered at high risk for hepatitis B if: ? You were born in a country where hepatitis B is common. Ask your health care provider which countries are considered high risk. ? Your parents were born in a high-risk country, and you have not been immunized against hepatitis B (hepatitis B vaccine). ? You have HIV or AIDS. ? You use needles to inject street drugs. ? You live with someone who has hepatitis B. ? You have had sex with someone who has hepatitis B. ? You get hemodialysis treatment. ? You take certain medicines for conditions, including cancer, organ transplantation, and autoimmune conditions.  Hepatitis C  Blood testing is recommended for: ? Everyone born from [birth year] through 1964. ? Anyone with known risk factors for hepatitis C.  Sexually transmitted infections (STIs)  You should be screened for sexually transmitted infections (STIs) including gonorrhea and chlamydia if: ? You are sexually active and are younger than 41 years of age. ? You are older than 41 years of age and your health care provider tells you that you are at risk for this type of infection. ? Your sexual activity has changed since you were last screened and you are at an increased risk for chlamydia or  gonorrhea. Ask your health care provider if you are at risk.  If you do not have HIV, but are at risk, it may be recommended that you take a prescription medicine daily to prevent HIV infection. This is called pre-exposure prophylaxis (PrEP). You are considered at risk if: ? You are sexually active and do not regularly use condoms or know the HIV status of your partner(s). ? You take  drugs by injection. ? You are sexually active with a partner who has HIV.  Talk with your health care provider about whether you are at high risk of being infected with HIV. If you choose to begin PrEP, you should first be tested for HIV. You should then be tested every 3 months for as long as you are taking PrEP. Pregnancy  If you are premenopausal and you may become pregnant, ask your health care provider about preconception counseling.  If you may become pregnant, take 400 to 800 micrograms (mcg) of folic acid every day.  If you want to prevent pregnancy, talk to your health care provider about birth control (contraception). Osteoporosis and menopause  Osteoporosis is a disease in which the bones lose minerals and strength with aging. This can result in serious bone fractures. Your risk for osteoporosis can be identified using a bone density scan.  If you are 33 years of age or older, or if you are at risk for osteoporosis and fractures, ask your health care provider if you should be screened.  Ask your health care provider whether you should take a calcium or vitamin D supplement to lower your risk for osteoporosis.  Menopause may have certain physical symptoms and risks.  Hormone replacement therapy may reduce some of these symptoms and risks. Talk to your health care provider about whether hormone replacement therapy is right for you. Follow these instructions at home:  Schedule regular health, dental, and eye exams.  Stay current with your immunizations.  Do not use any tobacco products including  cigarettes, chewing tobacco, or electronic cigarettes.  If you are pregnant, do not drink alcohol.  If you are breastfeeding, limit how much and how often you drink alcohol.  Limit alcohol intake to no more than 1 drink per day for nonpregnant women. One drink equals 12 ounces of beer, 5 ounces of wine, or 1 ounces of hard liquor.  Do not use street drugs.  Do not share needles.  Ask your health care provider for help if you need support or information about quitting drugs.  Tell your health care provider if you often feel depressed.  Tell your health care provider if you have ever been abused or do not feel safe at home. This information is not intended to replace advice given to you by your health care provider. Make sure you discuss any questions you have with your health care provider. Document Released: 07/17/2010 Document Revised: 06/09/2015 Document Reviewed: 10/05/2014 Elsevier Interactive Patient Education  [employer].

## 2017-01-23 MED ORDER — TRIAMCINOLONE ACETONIDE 0.1 % EX OINT: 1.0000 "application " | TOPICAL_OINTMENT | Freq: Two times a day (BID) | CUTANEOUS | 0 refills | Status: AC

## 2017-01-23 MED ORDER — NYSTATIN 100000 UNIT/GM EX OINT: 1.0000 "application " | TOPICAL_OINTMENT | Freq: Two times a day (BID) | CUTANEOUS | 0 refills | Status: DC

## 2017-01-23 NOTE — Addendum Note (Signed)
Addended by: Marchelle FolksMILLER, Tanekia Ryans G on: 01/23/2017 01:24 PM   Modules accepted: Orders

## 2017-03-12 ENCOUNTER — Ambulatory Visit
Admission: RE | Admit: 2017-03-12 | Discharge: 2017-03-12 | Disposition: A | Discharge status: Home / Self Care | Payer: 59 | Source: Ambulatory Visit | Attending: Obstetrics and Gynecology | Admitting: Obstetrics and Gynecology

## 2017-03-12 DIAGNOSIS — Z1231 Encounter for screening mammogram for malignant neoplasm of breast: Secondary | ICD-10-CM | POA: Insufficient documentation

## 2017-03-12 DIAGNOSIS — Z1239 Encounter for other screening for malignant neoplasm of breast: Secondary | ICD-10-CM

## 2017-03-27 DIAGNOSIS — Z0000 Encounter for general adult medical examination without abnormal findings: Secondary | ICD-10-CM | POA: Diagnosis not present

## 2017-03-27 DIAGNOSIS — I10 Essential (primary) hypertension: Secondary | ICD-10-CM | POA: Diagnosis not present

## 2017-03-28 DIAGNOSIS — M25561 Pain in right knee: Secondary | ICD-10-CM | POA: Diagnosis not present

## 2017-03-28 DIAGNOSIS — I10 Essential (primary) hypertension: Secondary | ICD-10-CM | POA: Diagnosis not present

## 2017-03-28 DIAGNOSIS — Z0000 Encounter for general adult medical examination without abnormal findings: Secondary | ICD-10-CM | POA: Diagnosis not present

## 2017-03-28 DIAGNOSIS — E7800 Pure hypercholesterolemia, unspecified: Secondary | ICD-10-CM | POA: Diagnosis not present

## 2017-08-29 DIAGNOSIS — H179 Unspecified corneal scar and opacity: Secondary | ICD-10-CM | POA: Diagnosis not present

## 2017-09-26 DIAGNOSIS — I10 Essential (primary) hypertension: Secondary | ICD-10-CM | POA: Diagnosis not present

## 2017-09-26 DIAGNOSIS — E7800 Pure hypercholesterolemia, unspecified: Secondary | ICD-10-CM | POA: Diagnosis not present

## 2017-09-30 DIAGNOSIS — I10 Essential (primary) hypertension: Secondary | ICD-10-CM | POA: Diagnosis not present

## 2017-09-30 DIAGNOSIS — J069 Acute upper respiratory infection, unspecified: Secondary | ICD-10-CM | POA: Diagnosis not present

## 2017-10-01 DIAGNOSIS — H18603 Keratoconus, unspecified, bilateral: Secondary | ICD-10-CM | POA: Diagnosis not present

## 2017-10-01 DIAGNOSIS — H18609 Keratoconus, unspecified, unspecified eye: Secondary | ICD-10-CM | POA: Diagnosis not present

## 2018-01-23 ENCOUNTER — Encounter: Payer: 59 | Admitting: Obstetrics and Gynecology

## 2018-01-29 ENCOUNTER — Encounter: Payer: 59 | Admitting: Obstetrics and Gynecology

## 2018-02-05 ENCOUNTER — Ambulatory Visit (INDEPENDENT_AMBULATORY_CARE_PROVIDER_SITE_OTHER): Payer: 59 | Admitting: Obstetrics and Gynecology

## 2018-02-05 ENCOUNTER — Encounter: Payer: Self-pay | Admitting: Obstetrics and Gynecology

## 2018-02-05 VITALS — BP 138/71 | HR 73 | Ht 67.0 in | Wt 395.1 lb

## 2018-02-05 DIAGNOSIS — Z1239 Encounter for other screening for malignant neoplasm of breast: Secondary | ICD-10-CM

## 2018-02-05 DIAGNOSIS — Z01419 Encounter for gynecological examination (general) (routine) without abnormal findings: Secondary | ICD-10-CM | POA: Diagnosis not present

## 2018-02-05 DIAGNOSIS — Z6841 Body Mass Index (BMI) 40.0 and over, adult: Secondary | ICD-10-CM | POA: Diagnosis not present

## 2018-02-05 NOTE — Progress Notes (Signed)
Patient comes in today for her yearly physical. She has no concerns today. She has labs drawn through her PCP.

## 2018-02-05 NOTE — Progress Notes (Signed)
HPI:      [name] is a 43 y.o. G1P1001 who LMP was Patient's last menstrual period was 01/27/2018.  Subjective:   She presents today for her annual examination.  She reports that she continues to experience monthly menses.  She has no complaints.  She receives her blood work through her primary PCP.  She gets yearly mammograms because of a history of breast cancer in her grandmother.  She had a Pap last year which was normal. She states that she has been recently working on weight loss and has begun to work out every week.    Hx: The following portions of the patient's history were reviewed and updated as appropriate:             She  has a past medical history of Family history of breast cancer, Hemorrhoids, Hypertension, Obesity, Ovarian cyst, and Vaginal Pap smear, abnormal. She does not have any pertinent problems on file. She  has a past surgical history that includes Colposcopy w/ biopsy / curettage (10/2011) and Wrist surgery (Left). Her family history includes Breast cancer in her maternal aunt, maternal aunt, and maternal grandmother; Colon cancer in her maternal aunt; Diabetes in her father; Heart disease in her father and mother; Ovarian cancer in her maternal aunt. She  reports that she has never smoked. She has never used smokeless tobacco. She reports current alcohol use. She reports that she does not use drugs. She has a current medication list which includes the following prescription(s): hydrochlorothiazide, multi-vitamins, nystatin ointment, nystatin-triamcinolone, and triamcinolone ointment. She has No Known Allergies.       Review of Systems:  Review of Systems  Constitutional: Denied constitutional symptoms, night sweats, recent illness, fatigue, fever, insomnia and weight loss.  Eyes: Denied eye symptoms, eye pain, photophobia, vision change and visual disturbance.  Ears/Nose/Throat/Neck: Denied ear, nose, throat or neck symptoms, hearing loss, nasal  discharge, sinus congestion and sore throat.  Cardiovascular: Denied cardiovascular symptoms, arrhythmia, chest pain/pressure, edema, exercise intolerance, orthopnea and palpitations.  Respiratory: Denied pulmonary symptoms, asthma, pleuritic pain, productive sputum, cough, dyspnea and wheezing.  Gastrointestinal: Denied, gastro-esophageal reflux, melena, nausea and vomiting.  Genitourinary: Denied genitourinary symptoms including symptomatic vaginal discharge, pelvic relaxation issues, and urinary complaints.  Musculoskeletal: Denied musculoskeletal symptoms, stiffness, swelling, muscle weakness and myalgia.  Dermatologic: Denied dermatology symptoms, rash and scar.  Neurologic: Denied neurology symptoms, dizziness, headache, neck pain and syncope.  Psychiatric: Denied psychiatric symptoms, anxiety and depression.  Endocrine: Denied endocrine symptoms including hot flashes and night sweats.   Meds:   Current Outpatient Medications on File Prior to Visit  Medication Sig Dispense Refill  . hydrochlorothiazide (HYDRODIURIL) 25 MG tablet Take 25 mg by mouth daily.    . Multiple Vitamin (MULTI-VITAMINS) TABS Take by mouth.    . nystatin ointment (MYCOSTATIN) Apply 1 application topically 2 (two) times daily. 30 g 0  . nystatin-triamcinolone (MYCOLOG II) cream Apply 1 application topically 2 (two) times daily. For 10-14 days 60 g 1  . triamcinolone ointment (KENALOG) 0.1 % Apply 1 application topically 2 (two) times daily. 30 g 0   No current facility-administered medications on file prior to visit.     Objective:     Vitals:   02/05/18 0832  BP: 138/71  Pulse: 73              Physical examination General NAD, Conversant  HEENT Atraumatic; Op clear with mmm.  Normo-cephalic. Pupils reactive. Anicteric sclerae  Thyroid/Neck Smooth without nodularity or  enlargement. Normal ROM.  Neck Supple.  Skin No rashes, lesions or ulceration. Normal palpated skin turgor. No nodularity.  Breasts:  No masses or discharge.  Symmetric.  No axillary adenopathy.  Lungs: Clear to auscultation.No rales or wheezes. Normal Respiratory effort, no retractions.  Heart: NSR.  No murmurs or rubs appreciated. No periferal edema  Abdomen: Soft.  Non-tender.  No masses.  No HSM. No hernia  Extremities: Moves all appropriately.  Normal ROM for age. No lymphadenopathy.  Neuro: Oriented to PPT.  Normal mood. Normal affect.     Pelvic:   Vulva: Normal appearance.  No lesions.  Vagina: No lesions or abnormalities noted.  Support: Normal pelvic support.  Urethra No masses tenderness or scarring.  Meatus Normal size without lesions or prolapse.  Cervix: Normal appearance.  No lesions.  Anus: Normal exam.  No lesions.  Perineum: Normal exam.  No lesions.        Bimanual   Uterus: Normal size.  Non-tender.  Mobile.  AV.  Adnexae: No masses.  Non-tender to palpation.  Cul-de-sac: Negative for abnormality.    Exam limited by patient body habitus  Assessment:    G1P1001 Patient Active Problem List   Diagnosis Date Noted  . Monilia infection 01/22/2017  . Morbid obesity with BMI of 60.0-69.9, adult (HCC) 01/22/2017  . Essential (primary) hypertension 01/06/2015     1. Well woman exam   2. Screening for breast cancer   3. Morbid obesity with body mass index (BMI) of 60.0 to 69.9 in adult [facility](HCC)        Plan:            1. Basic Screening Recommendations The basic screening recommendations for asymptomatic women were discussed with the patient during her visit.  The age-appropriate recommendations were discussed with her and the rational for the tests reviewed.  When I am informed by the patient that another primary care physician has previously obtained the age-appropriate tests and they are up-to-date, only outstanding tests are ordered and referrals given as necessary.  Abnormal results of tests will be discussed with her when all of her results are completed. Mammogram ordered 2.  Weight loss  and recent exercise encouraged  Orders Orders Placed This Encounter  Procedures  . MM 3D SCREEN BREAST BILATERAL    No orders of the defined types were placed in this encounter.     F/U  Return in about 1 year (around 02/06/2019) for Annual Physical.  Elonda Huskyavid J. Xabi Wittler, M.D. 02/05/2018 9:03 AM

## 2018-03-10 IMAGING — MG MM DIGITAL SCREENING BILAT W/ CAD
7 series · 7 of 7 positions shown · non-contrast
Comparison: Previous exam(s).

CLINICAL DATA: Screening.

EXAM:
DIGITAL SCREENING BILATERAL MAMMOGRAM WITH CAD

[R MLO (1 of 2)]
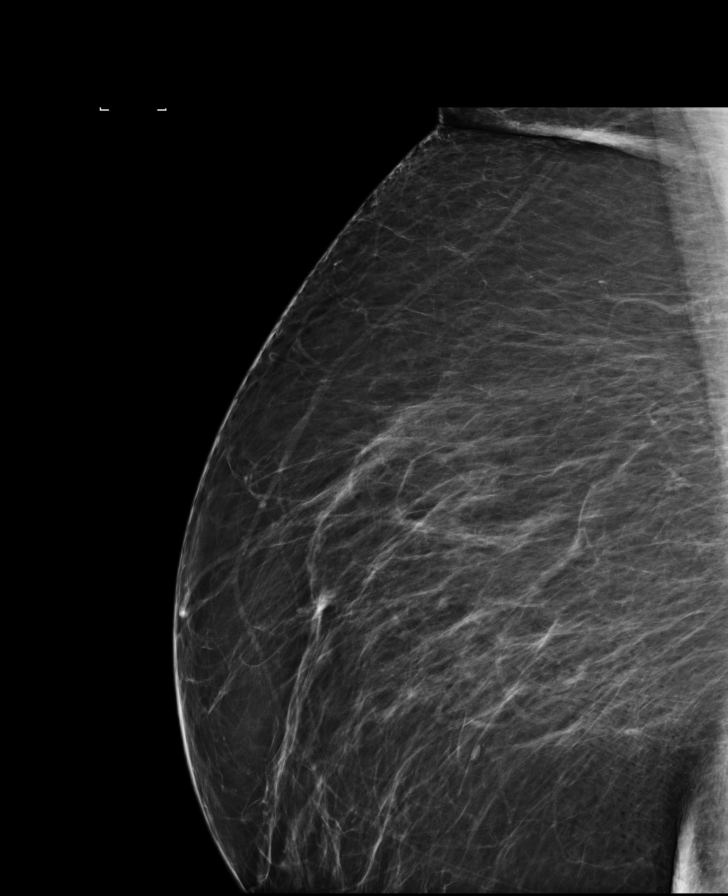

[L MLO (1 of 2)]
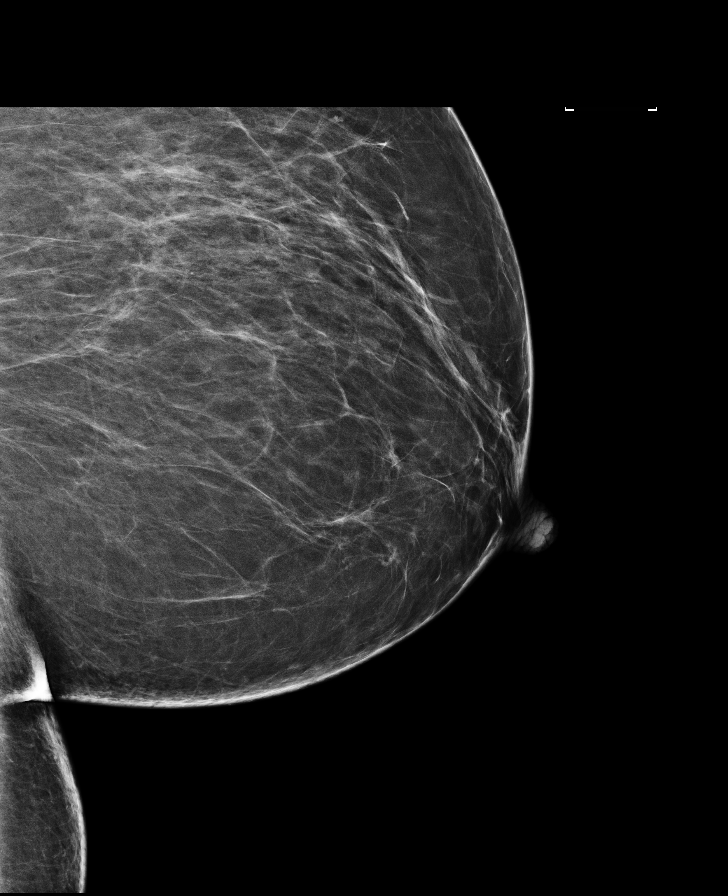

[R MLO (2 of 2)]
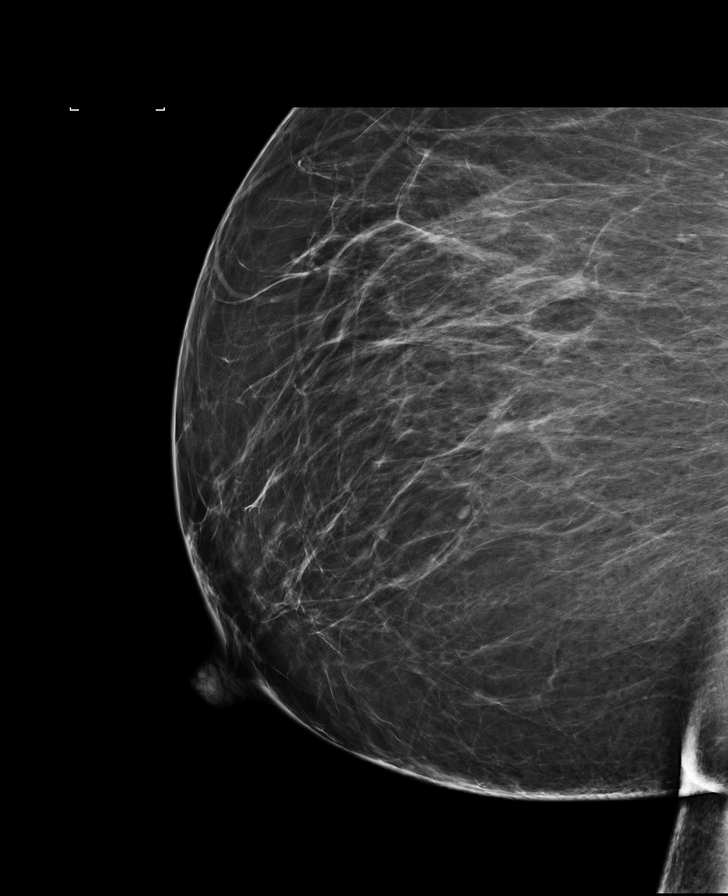

[L CC]
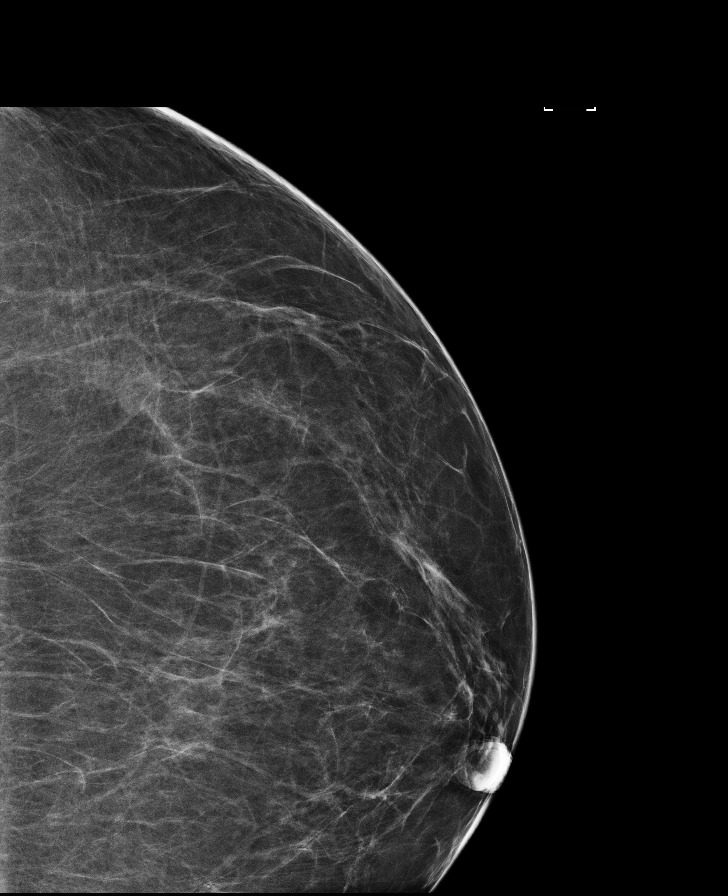

[R CV]
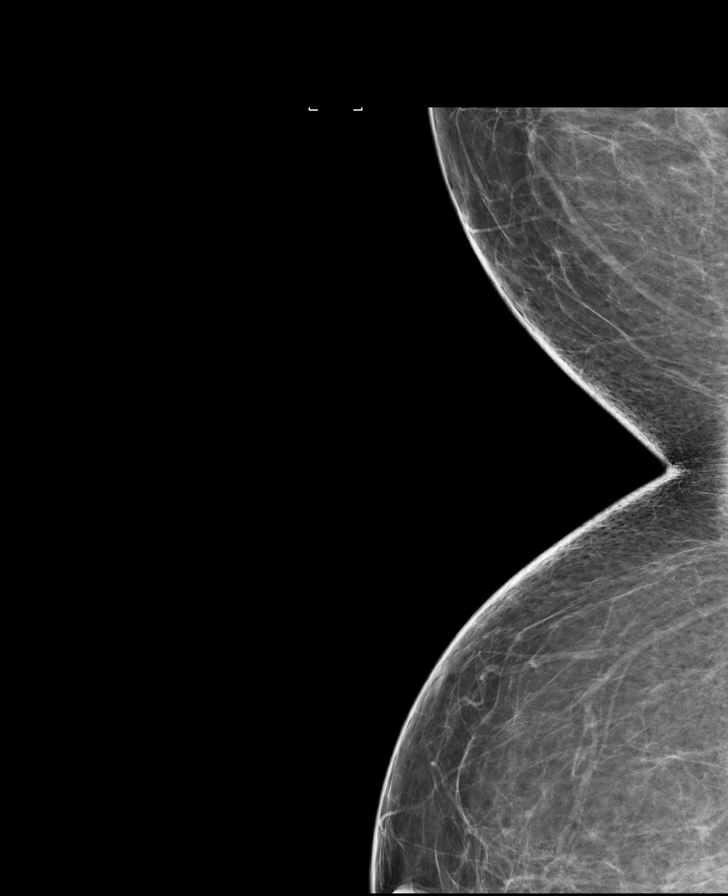

[L MLO (2 of 2)]
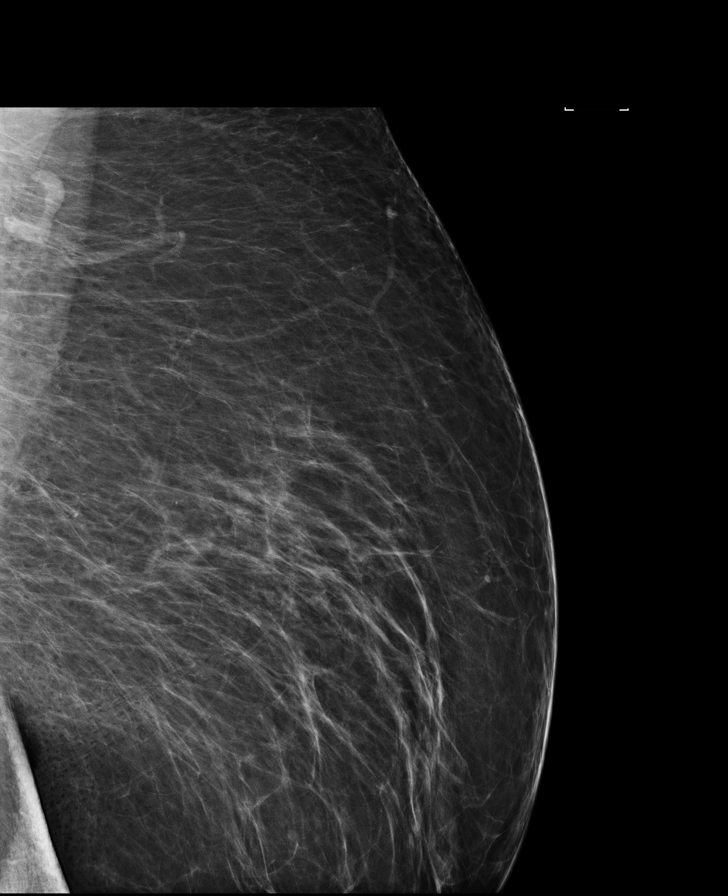

[R CC]
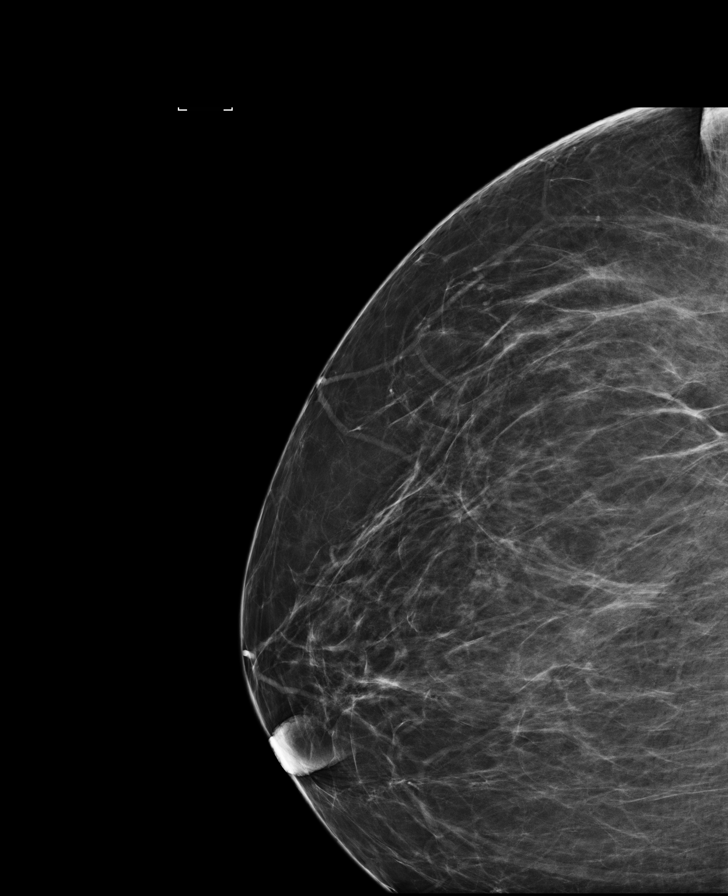

[7 of 7 positions shown; findings below may reference images not displayed]

ACR Breast Density Category b: There are scattered areas of
fibroglandular density.
FINDINGS: There are no findings suspicious for malignancy. Images were
processed with CAD.
IMPRESSION: No mammographic evidence of malignancy. A result letter of this
screening mammogram will be mailed directly to the patient.

RECOMMENDATION:
Screening mammogram in one year. (Code:AS-G-LCT)

BI-RADS CATEGORY  1: Negative.

## 2018-03-17 ENCOUNTER — Ambulatory Visit
Admission: RE | Admit: 2018-03-17 | Discharge: 2018-03-17 | Disposition: A | Discharge status: Home / Self Care | Payer: 59 | Source: Ambulatory Visit | Attending: Obstetrics and Gynecology | Admitting: Obstetrics and Gynecology

## 2018-03-17 DIAGNOSIS — Z01419 Encounter for gynecological examination (general) (routine) without abnormal findings: Secondary | ICD-10-CM | POA: Insufficient documentation

## 2018-03-17 DIAGNOSIS — Z1231 Encounter for screening mammogram for malignant neoplasm of breast: Secondary | ICD-10-CM | POA: Insufficient documentation

## 2018-03-17 DIAGNOSIS — Z1239 Encounter for other screening for malignant neoplasm of breast: Secondary | ICD-10-CM

## 2019-02-10 ENCOUNTER — Encounter: Payer: 59 | Admitting: Obstetrics and Gynecology

## 2019-03-02 ENCOUNTER — Other Ambulatory Visit: Payer: Self-pay | Admitting: Family Medicine

## 2019-03-02 ENCOUNTER — Other Ambulatory Visit: Payer: Self-pay | Admitting: Obstetrics and Gynecology

## 2019-03-02 DIAGNOSIS — Z1231 Encounter for screening mammogram for malignant neoplasm of breast: Secondary | ICD-10-CM

## 2019-03-03 ENCOUNTER — Ambulatory Visit (INDEPENDENT_AMBULATORY_CARE_PROVIDER_SITE_OTHER): Payer: 59 | Admitting: Obstetrics and Gynecology

## 2019-03-03 ENCOUNTER — Encounter: Payer: Self-pay | Admitting: Obstetrics and Gynecology

## 2019-03-03 ENCOUNTER — Other Ambulatory Visit (HOSPITAL_COMMUNITY)
Admission: RE | Admit: 2019-03-03 | Discharge: 2019-03-03 | Disposition: A | Discharge status: Home / Self Care | Payer: 59 | Source: Ambulatory Visit | Attending: Obstetrics and Gynecology | Admitting: Obstetrics and Gynecology

## 2019-03-03 ENCOUNTER — Other Ambulatory Visit: Payer: Self-pay

## 2019-03-03 VITALS — BP 137/75 | HR 80 | Ht 67.0 in | Wt 370.0 lb

## 2019-03-03 DIAGNOSIS — Z124 Encounter for screening for malignant neoplasm of cervix: Secondary | ICD-10-CM | POA: Insufficient documentation

## 2019-03-03 DIAGNOSIS — Z6841 Body Mass Index (BMI) 40.0 and over, adult: Secondary | ICD-10-CM

## 2019-03-03 DIAGNOSIS — Z01419 Encounter for gynecological examination (general) (routine) without abnormal findings: Secondary | ICD-10-CM

## 2019-03-03 NOTE — Addendum Note (Signed)
Addended by: Dorian Pod on: 03/03/2019 10:19 AM   Modules accepted: Orders

## 2019-03-03 NOTE — Progress Notes (Signed)
HPI:      [name] is a 44 y.o. G1P1001 who LMP was Patient's last menstrual period was 02/10/2019.  Subjective:   She presents today for her annual examination.  She has no complaints.  She is receiving yearly mammograms because of a family history.  She has 1 scheduled for this year already. Patient continues to exercise and has lost 25 pounds in the last year.    Hx: The following portions of the patient's history were reviewed and updated as appropriate:             She  has a past medical history of Family history of breast cancer, Hemorrhoids, Hypertension, Obesity, Ovarian cyst, and Vaginal Pap smear, abnormal. She does not have any pertinent problems on file. She  has a past surgical history that includes Colposcopy w/ biopsy / curettage (10/2011) and Wrist surgery (Left). Her family history includes Breast cancer in her maternal aunt, maternal aunt, and maternal grandmother; Colon cancer in her maternal aunt; Diabetes in her father; Heart disease in her father and mother; Ovarian cancer in her maternal aunt. She  reports that she has never smoked. She has never used smokeless tobacco. She reports current alcohol use. She reports that she does not use drugs. She has a current medication list which includes the following prescription(s): hydrochlorothiazide, multi-vitamins, nystatin ointment, nystatin-triamcinolone, and triamcinolone ointment. She has No Known Allergies.       Review of Systems:  Review of Systems  Constitutional: Denied constitutional symptoms, night sweats, recent illness, fatigue, fever, insomnia and weight loss.  Eyes: Denied eye symptoms, eye pain, photophobia, vision change and visual disturbance.  Ears/Nose/Throat/Neck: Denied ear, nose, throat or neck symptoms, hearing loss, nasal discharge, sinus congestion and sore throat.  Cardiovascular: Denied cardiovascular symptoms, arrhythmia, chest pain/pressure, edema, exercise intolerance, orthopnea and  palpitations.  Respiratory: Denied pulmonary symptoms, asthma, pleuritic pain, productive sputum, cough, dyspnea and wheezing.  Gastrointestinal: Denied, gastro-esophageal reflux, melena, nausea and vomiting.  Genitourinary: Denied genitourinary symptoms including symptomatic vaginal discharge, pelvic relaxation issues, and urinary complaints.  Musculoskeletal: Denied musculoskeletal symptoms, stiffness, swelling, muscle weakness and myalgia.  Dermatologic: Denied dermatology symptoms, rash and scar.  Neurologic: Denied neurology symptoms, dizziness, headache, neck pain and syncope.  Psychiatric: Denied psychiatric symptoms, anxiety and depression.  Endocrine: Denied endocrine symptoms including hot flashes and night sweats.   Meds:   Current Outpatient Medications on File Prior to Visit  Medication Sig Dispense Refill  . hydrochlorothiazide (HYDRODIURIL) 25 MG tablet Take 25 mg by mouth daily.    . Multiple Vitamin (MULTI-VITAMINS) TABS Take by mouth.    . nystatin ointment (MYCOSTATIN) Apply 1 application topically 2 (two) times daily. 30 g 0  . nystatin-triamcinolone (MYCOLOG II) cream Apply 1 application topically 2 (two) times daily. For 10-14 days 60 g 1  . triamcinolone ointment (KENALOG) 0.1 % Apply 1 application topically 2 (two) times daily. 30 g 0   No current facility-administered medications on file prior to visit.    Objective:     Vitals:   03/03/19 0936  BP: 137/75  Pulse: 80              Physical examination General NAD, Conversant  HEENT Atraumatic; Op clear with mmm.  Normo-cephalic. Pupils reactive. Anicteric sclerae  Thyroid/Neck Smooth without nodularity or enlargement. Normal ROM.  Neck Supple.  Skin No rashes, lesions or ulceration. Normal palpated skin turgor. No nodularity.  Breasts: No masses or discharge.  Symmetric.  No axillary adenopathy.  Lungs: Clear to auscultation.No rales or wheezes. Normal Respiratory effort, no retractions.  Heart: NSR.  No  murmurs or rubs appreciated. No periferal edema  Abdomen: Soft.  Non-tender.  No masses.  No HSM. No hernia  Extremities: Moves all appropriately.  Normal ROM for age. No lymphadenopathy.  Neuro: Oriented to PPT.  Normal mood. Normal affect.     Pelvic:   Vulva: Normal appearance.  No lesions.  Vagina: No lesions or abnormalities noted.  Support: Normal pelvic support.  Urethra No masses tenderness or scarring.  Meatus Normal size without lesions or prolapse.  Cervix: Normal appearance.  No lesions.  Anus: Normal exam.  No lesions.  Perineum: Normal exam.  No lesions.        Bimanual   Uterus: Normal size.  Non-tender.  Mobile.  AV.  Adnexae: No masses.  Non-tender to palpation.  Cul-de-sac: Negative for abnormality.    Exam limited by patient body habitus  Assessment:    G1P1001 Patient Active Problem List   Diagnosis Date Noted  . Monilia infection 01/22/2017  . Morbid obesity with BMI of 60.0-69.9, adult ([place]) 01/22/2017  . Essential (primary) hypertension 01/06/2015     1. Well woman exam with routine gynecological exam   2. Morbid obesity with BMI of 50.0-59.9, adult ([place])        Plan:            1.  Basic Screening Recommendations The basic screening recommendations for asymptomatic women were discussed with the patient during her visit.  The age-appropriate recommendations were discussed with her and the rational for the tests reviewed.  When I am informed by the patient that another primary care physician has previously obtained the age-appropriate tests and they are up-to-date, only outstanding tests are ordered and referrals given as necessary.  Abnormal results of tests will be discussed with her when all of her results are completed.  Routine preventative health maintenance measures emphasized: Exercise/Diet/Weight control, Tobacco Warnings, Alcohol/Substance use risks and Stress Management Pap performed-mammogram as scheduled PCP performs routine blood  work. 2.   Patient encouraged in her continued efforts at weight loss including diet and exercise. Orders No orders of the defined types were placed in this encounter.   No orders of the defined types were placed in this encounter.       F/U  No follow-ups on file.  Finis Bud, M.D. 03/03/2019 10:08 AM

## 2019-03-06 LAB — CYTOLOGY - PAP
Comment: NEGATIVE
Diagnosis: NEGATIVE
High risk HPV: NEGATIVE

## 2019-03-23 ENCOUNTER — Ambulatory Visit
Admission: RE | Admit: 2019-03-23 | Discharge: 2019-03-23 | Disposition: A | Discharge status: Home / Self Care | Payer: 59 | Source: Ambulatory Visit | Attending: Family Medicine | Admitting: Family Medicine

## 2019-03-23 DIAGNOSIS — Z1231 Encounter for screening mammogram for malignant neoplasm of breast: Secondary | ICD-10-CM | POA: Insufficient documentation

## 2020-02-19 ENCOUNTER — Other Ambulatory Visit: Payer: Self-pay | Admitting: Family Medicine

## 2020-02-19 DIAGNOSIS — Z1231 Encounter for screening mammogram for malignant neoplasm of breast: Secondary | ICD-10-CM

## 2020-03-08 ENCOUNTER — Encounter: Payer: Self-pay | Admitting: Obstetrics and Gynecology

## 2020-03-08 ENCOUNTER — Ambulatory Visit (INDEPENDENT_AMBULATORY_CARE_PROVIDER_SITE_OTHER): Payer: Self-pay | Admitting: Obstetrics and Gynecology

## 2020-03-08 ENCOUNTER — Other Ambulatory Visit: Payer: Self-pay

## 2020-03-08 VITALS — BP 137/82 | HR 75 | Ht 67.0 in | Wt 379.0 lb

## 2020-03-08 DIAGNOSIS — Z6841 Body Mass Index (BMI) 40.0 and over, adult: Secondary | ICD-10-CM

## 2020-03-08 DIAGNOSIS — Z01419 Encounter for gynecological examination (general) (routine) without abnormal findings: Secondary | ICD-10-CM

## 2020-03-08 NOTE — Progress Notes (Signed)
HPI:      [name] is a 45 y.o. G1P1001 who LMP was Patient's last menstrual period was 03/04/2020.  Subjective:   She presents today for her annual examination.  She has no complaints.  She continues to have normal regular cycles. She is scheduled for gastric bypass surgery (switch) next month.  She is understandably very excited about this.    Hx: The following portions of the patient's history were reviewed and updated as appropriate:             She  has a past medical history of Family history of breast cancer, Hemorrhoids, Hypertension, Obesity, Ovarian cyst, and Vaginal Pap smear, abnormal. She does not have any pertinent problems on file. She  has a past surgical history that includes Colposcopy w/ biopsy / curettage (10/2011) and Wrist surgery (Left). Her family history includes Breast cancer in her maternal aunt, maternal aunt, and maternal grandmother; Colon cancer in her maternal aunt; Diabetes in her father; Heart disease in her father and mother; Ovarian cancer in her maternal aunt. She  reports that she has never smoked. She has never used smokeless tobacco. She reports current alcohol use. She reports that she does not use drugs. She has a current medication list which includes the following prescription(s): hydrochlorothiazide, multi-vitamins, nystatin ointment, nystatin-triamcinolone, and triamcinolone ointment. She has No Known Allergies.       Review of Systems:  Review of Systems  Constitutional: Denied constitutional symptoms, night sweats, recent illness, fatigue, fever, insomnia and weight loss.  Eyes: Denied eye symptoms, eye pain, photophobia, vision change and visual disturbance.  Ears/Nose/Throat/Neck: Denied ear, nose, throat or neck symptoms, hearing loss, nasal discharge, sinus congestion and sore throat.  Cardiovascular: Denied cardiovascular symptoms, arrhythmia, chest pain/pressure, edema, exercise intolerance, orthopnea and palpitations.   Respiratory: Denied pulmonary symptoms, asthma, pleuritic pain, productive sputum, cough, dyspnea and wheezing.  Gastrointestinal: Denied, gastro-esophageal reflux, melena, nausea and vomiting.  Genitourinary: Denied genitourinary symptoms including symptomatic vaginal discharge, pelvic relaxation issues, and urinary complaints.  Musculoskeletal: Denied musculoskeletal symptoms, stiffness, swelling, muscle weakness and myalgia.  Dermatologic: Denied dermatology symptoms, rash and scar.  Neurologic: Denied neurology symptoms, dizziness, headache, neck pain and syncope.  Psychiatric: Denied psychiatric symptoms, anxiety and depression.  Endocrine: Denied endocrine symptoms including hot flashes and night sweats.   Meds:   Current Outpatient Medications on File Prior to Visit  Medication Sig Dispense Refill  . hydrochlorothiazide (HYDRODIURIL) 25 MG tablet Take 25 mg by mouth daily.    . Multiple Vitamin (MULTI-VITAMINS) TABS Take by mouth.    . nystatin ointment (MYCOSTATIN) Apply 1 application topically 2 (two) times daily. 30 g 0  . nystatin-triamcinolone (MYCOLOG II) cream Apply 1 application topically 2 (two) times daily. For 10-14 days 60 g 1  . triamcinolone ointment (KENALOG) 0.1 % Apply 1 application topically 2 (two) times daily. 30 g 0   No current facility-administered medications on file prior to visit.          Objective:     Vitals:   03/08/20 0907  BP: 137/82  Pulse: 75    Filed Weights   03/08/20 0907  Weight: (!) 379 lb (171.9 kg)              Physical examination General NAD, Conversant  HEENT Atraumatic; Op clear with mmm.  Normo-cephalic. Pupils reactive. Anicteric sclerae  Thyroid/Neck Smooth without nodularity or enlargement. Normal ROM.  Neck Supple.  Skin No rashes, lesions or ulceration. Normal palpated skin turgor.  No nodularity.  Breasts: No masses or discharge.  Symmetric.  No axillary adenopathy.  Lungs: Clear to auscultation.No rales or  wheezes. Normal Respiratory effort, no retractions.  Heart: NSR.  No murmurs or rubs appreciated. No periferal edema  Abdomen: Soft.  Non-tender.  No masses.  No HSM. No hernia  Extremities: Moves all appropriately.  Normal ROM for age. No lymphadenopathy.  Neuro: Oriented to PPT.  Normal mood. Normal affect.     Pelvic:   Vulva: Normal appearance.  No lesions.  Vagina: No lesions or abnormalities noted.  Support: Normal pelvic support.  Urethra No masses tenderness or scarring.  Meatus Normal size without lesions or prolapse.  Cervix: Normal appearance.  No lesions.  Anus: Normal exam.  No lesions.  Perineum: Normal exam.  No lesions.        Bimanual   Uterus: Normal size.  Non-tender.  Mobile.  AV.  Adnexae: No masses.  Non-tender to palpation.  Cul-de-sac: Negative for abnormality.   Exam limited by patient body habitus  Assessment:    G1P1001 Patient Active Problem List   Diagnosis Date Noted  . Monilia infection 01/22/2017  . Morbid obesity with BMI of 60.0-69.9, adult ([facility]) 01/22/2017  . Essential (primary) hypertension 01/06/2015     1. Well woman exam with routine gynecological exam   2. Morbid obesity with BMI of 50.0-59.9, adult ([facility])        Plan:            1.  Basic Screening Recommendations The basic screening recommendations for asymptomatic women were discussed with the patient during her visit.  The age-appropriate recommendations were discussed with her and the rational for the tests reviewed.  When I am informed by the patient that another primary care physician has previously obtained the age-appropriate tests and they are up-to-date, only outstanding tests are ordered and referrals given as necessary.  Abnormal results of tests will be discussed with her when all of her results are completed.  Routine preventative health maintenance measures emphasized: Exercise/Diet/Weight control, Tobacco Warnings, Alcohol/Substance use risks and Stress Management PCP  does mammogram and lab work.  Orders No orders of the defined types were placed in this encounter.   No orders of the defined types were placed in this encounter.       F/U  Return in about 1 year (around 03/08/2021) for Annual Physical.  Elonda Husky, M.D. 03/08/2020 9:32 AM

## 2020-03-23 ENCOUNTER — Other Ambulatory Visit: Payer: Self-pay

## 2020-03-23 ENCOUNTER — Ambulatory Visit
Admission: RE | Admit: 2020-03-23 | Discharge: 2020-03-23 | Disposition: A | Discharge status: Home / Self Care | Payer: BC Managed Care – PPO | Source: Ambulatory Visit | Attending: Family Medicine | Admitting: Family Medicine

## 2020-03-23 DIAGNOSIS — Z1231 Encounter for screening mammogram for malignant neoplasm of breast: Secondary | ICD-10-CM | POA: Insufficient documentation

## 2020-10-26 ENCOUNTER — Other Ambulatory Visit: Payer: Self-pay

## 2020-10-26 ENCOUNTER — Encounter: Payer: Self-pay | Admitting: Emergency Medicine

## 2020-10-26 ENCOUNTER — Emergency Department: Payer: BC Managed Care – PPO

## 2020-10-26 ENCOUNTER — Emergency Department
Admission: EM | Admit: 2020-10-26 | Discharge: 2020-10-26 | Disposition: A | Discharge status: Home / Self Care | Payer: BC Managed Care – PPO | Attending: Emergency Medicine | Admitting: Emergency Medicine

## 2020-10-26 DIAGNOSIS — Z79899 Other long term (current) drug therapy: Secondary | ICD-10-CM | POA: Diagnosis not present

## 2020-10-26 DIAGNOSIS — S199XXA Unspecified injury of neck, initial encounter: Secondary | ICD-10-CM | POA: Diagnosis present

## 2020-10-26 DIAGNOSIS — Y92410 Unspecified street and highway as the place of occurrence of the external cause: Secondary | ICD-10-CM | POA: Diagnosis not present

## 2020-10-26 DIAGNOSIS — S161XXA Strain of muscle, fascia and tendon at neck level, initial encounter: Secondary | ICD-10-CM | POA: Diagnosis not present

## 2020-10-26 DIAGNOSIS — I10 Essential (primary) hypertension: Secondary | ICD-10-CM | POA: Insufficient documentation

## 2020-10-26 DIAGNOSIS — S39012A Strain of muscle, fascia and tendon of lower back, initial encounter: Secondary | ICD-10-CM | POA: Diagnosis not present

## 2020-10-26 NOTE — Discharge Instructions (Signed)
Take tylenol every 6-8 hours.
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
## 2021-02-23 ENCOUNTER — Other Ambulatory Visit: Payer: Self-pay | Admitting: Family Medicine

## 2021-02-23 DIAGNOSIS — Z1231 Encounter for screening mammogram for malignant neoplasm of breast: Secondary | ICD-10-CM

## 2021-03-14 ENCOUNTER — Encounter: Payer: BC Managed Care – PPO | Admitting: Obstetrics and Gynecology

## 2021-03-14 DIAGNOSIS — Z1231 Encounter for screening mammogram for malignant neoplasm of breast: Secondary | ICD-10-CM

## 2021-03-14 DIAGNOSIS — Z01419 Encounter for gynecological examination (general) (routine) without abnormal findings: Secondary | ICD-10-CM

## 2021-03-31 ENCOUNTER — Other Ambulatory Visit: Payer: Self-pay

## 2021-03-31 ENCOUNTER — Ambulatory Visit
Admission: RE | Admit: 2021-03-31 | Discharge: 2021-03-31 | Disposition: A | Discharge status: Home / Self Care | Payer: BC Managed Care – PPO | Source: Ambulatory Visit | Attending: Family Medicine | Admitting: Family Medicine

## 2021-03-31 DIAGNOSIS — Z1231 Encounter for screening mammogram for malignant neoplasm of breast: Secondary | ICD-10-CM | POA: Insufficient documentation

## 2021-04-27 ENCOUNTER — Encounter: Payer: Self-pay | Admitting: Obstetrics and Gynecology

## 2021-04-27 ENCOUNTER — Ambulatory Visit (INDEPENDENT_AMBULATORY_CARE_PROVIDER_SITE_OTHER): Payer: BC Managed Care – PPO | Admitting: Obstetrics and Gynecology

## 2021-04-27 VITALS — BP 117/84 | HR 62 | Ht 67.0 in | Wt 262.2 lb

## 2021-04-27 DIAGNOSIS — Z01419 Encounter for gynecological examination (general) (routine) without abnormal findings: Secondary | ICD-10-CM | POA: Diagnosis not present

## 2021-04-27 NOTE — Progress Notes (Signed)
HPI: ?     [name] is a 46 y.o. G1P1001 who LMP was Patient's last menstrual period was 04/10/2021. ? ?Subjective:  ? ?She presents today for her annual examination.  She has undergone weight loss surgery and has lost a significant amount of weight through the surgery in addition to diet and exercise!!!!  She says she feels much better in life. ?She is sexually active and using condoms for birth control.  She is considering other forms of birth control. ? ?  Hx: ?The following portions of the patient's history were reviewed and updated as appropriate: ?            She  has a past medical history of Family history of breast cancer, Hemorrhoids, Hypertension, Obesity, Ovarian cyst, and Vaginal Pap smear, abnormal. ?She does not have any pertinent problems on file. ?She  has a past surgical history that includes Colposcopy w/ biopsy / curettage (10/2011) and Wrist surgery (Left). ?Her family history includes Breast cancer in her maternal aunt, maternal aunt, and maternal grandmother; Colon cancer in her maternal aunt; Diabetes in her father; Heart disease in her father and mother; Ovarian cancer in her maternal aunt. ?She  reports that she has never smoked. She has never used smokeless tobacco. She reports current alcohol use. She reports that she does not use drugs. ?She has a current medication list which includes the following prescription(s): amlodipine, multi-vitamins, nystatin ointment, nystatin-triamcinolone, and triamcinolone ointment. ?She has No Known Allergies. ?      ?Review of Systems:  ?Review of Systems ? ?Constitutional: Denied constitutional symptoms, night sweats, recent illness, fatigue, fever, insomnia and weight loss.  ?Eyes: Denied eye symptoms, eye pain, photophobia, vision change and visual disturbance.  ?Ears/Nose/Throat/Neck: Denied ear, nose, throat or neck symptoms, hearing loss, nasal discharge, sinus congestion and sore throat.  ?Cardiovascular: Denied cardiovascular  symptoms, arrhythmia, chest pain/pressure, edema, exercise intolerance, orthopnea and palpitations.  ?Respiratory: Denied pulmonary symptoms, asthma, pleuritic pain, productive sputum, cough, dyspnea and wheezing.  ?Gastrointestinal: Denied, gastro-esophageal reflux, melena, nausea and vomiting.  ?Genitourinary: Denied genitourinary symptoms including symptomatic vaginal discharge, pelvic relaxation issues, and urinary complaints.  ?Musculoskeletal: Denied musculoskeletal symptoms, stiffness, swelling, muscle weakness and myalgia.  ?Dermatologic: Denied dermatology symptoms, rash and scar.  ?Neurologic: Denied neurology symptoms, dizziness, headache, neck pain and syncope.  ?Psychiatric: Denied psychiatric symptoms, anxiety and depression.  ?Endocrine: Denied endocrine symptoms including hot flashes and night sweats.  ? ?Meds: ?  ?Current Outpatient Medications on File Prior to Visit  ?Medication Sig Dispense Refill  ? amLODipine (NORVASC) 5 MG tablet Take 5 mg by mouth daily.    ? Multiple Vitamin (MULTI-VITAMINS) TABS Take by mouth.    ? nystatin ointment (MYCOSTATIN) Apply 1 application topically 2 (two) times daily. 30 g 0  ? nystatin-triamcinolone (MYCOLOG II) cream Apply 1 application topically 2 (two) times daily. For 10-14 days 60 g 1  ? triamcinolone ointment (KENALOG) 0.1 % Apply 1 application topically 2 (two) times daily. 30 g 0  ? ?No current facility-administered medications on file prior to visit.  ? ? ? ?Objective:  ?  ? ?Vitals:  ? 04/27/21 0813  ?BP: 117/84  ?Pulse: 62  ?  ?Filed Weights  ? 04/27/21 0813  ?Weight: 262 lb 3.2 oz (118.9 kg)  ? ?  ?         Physical examination ?General NAD, Conversant  ?HEENT Atraumatic; Op clear with mmm.  Normo-cephalic. Pupils reactive. Anicteric sclerae  ?Thyroid/Neck Smooth without nodularity or  enlargement. Normal ROM.  Neck Supple.  ?Skin No rashes, lesions or ulceration. Normal palpated skin turgor. No nodularity.  ?Breasts: No masses or discharge.   Symmetric.  No axillary adenopathy.  ?Lungs: Clear to auscultation.No rales or wheezes. Normal Respiratory effort, no retractions.  ?Heart: NSR.  No murmurs or rubs appreciated. No periferal edema  ?Abdomen: Soft.  Non-tender.  No masses.  No HSM. No hernia  ?Extremities: Moves all appropriately.  Normal ROM for age. No lymphadenopathy.  ?Neuro: Oriented to PPT.  Normal mood. Normal affect.  ? ?  Pelvic:   ?Vulva: Normal appearance.  No lesions.  ?Vagina: No lesions or abnormalities noted.  ?Support: Normal pelvic support.  ?Urethra No masses tenderness or scarring.  ?Meatus Normal size without lesions or prolapse.  ?Cervix: Normal appearance.  No lesions.  ?Anus: Normal exam.  No lesions.  ?Perineum: Normal exam.  No lesions.  ?      Bimanual   ?Uterus: Normal size.  Non-tender.  Mobile.  AV.  ?Adnexae: No masses.  Non-tender to palpation.  ?Cul-de-sac: Negative for abnormality.  ? ? ? ?Assessment:  ?  ?G1P1001 ?Patient Active Problem List  ? Diagnosis Date Noted  ? Monilia infection 01/22/2017  ? Morbid obesity with BMI of 60.0-69.9, adult (HCC) 01/22/2017  ? Essential (primary) hypertension 01/06/2015  ? ?  ?1. Well woman exam with routine gynecological exam   ? ? Patient has undergone weight loss surgery and in addition is dieting and exercising.  She has lost a great deal of weight. ? ? ?Plan:  ?  ?       ? 1.  Basic Screening Recommendations ?The basic screening recommendations for asymptomatic women were discussed with the patient during her visit.  The age-appropriate recommendations were discussed with her and the rational for the tests reviewed.  When I am informed by the patient that another primary care physician has previously obtained the age-appropriate tests and they are up-to-date, only outstanding tests are ordered and referrals given as necessary.  Abnormal results of tests will be discussed with her when all of her results are completed.  Routine preventative health maintenance measures  emphasized: Exercise/Diet/Weight control, Tobacco Warnings, Alcohol/Substance use risks and Stress Management ?Labs ordered ?2.  Have discussed multiple forms of birth control.  Patient was also interested in discussing Phexxi.  At the conclusion of her visit she had not yet decided on a form of birth control other than her current condom use. ? ?Orders ?Orders Placed This Encounter  ?Procedures  ? Basic metabolic panel  ? CBC  ? TSH  ? Lipid panel  ? Hemoglobin A1c  ? ? No orders of the defined types were placed in this encounter. ?    ?  ?  F/U ? Return in about 1 year (around 04/28/2022) for Annual Physical. ? ?Elonda Husky, M.D. ?04/27/2021 ?8:37 AM ? ? ? ?

## 2021-04-27 NOTE — Progress Notes (Signed)
Patients presents for annual exam today. Patient states she has been doing well over the last year. She states her weight loss surgery went good and she is happy about her progress. Patients annual labs are ordered. She is up to date on pap smear and mammogram. Patient states no other questions or concerns at this time.   ?

## 2021-04-28 LAB — CBC
Hematocrit: 40.4 % (ref 34.0–46.6)
Hemoglobin: 13.2 g/dL (ref 11.1–15.9)
MCH: 29.1 pg (ref 26.6–33.0)
MCHC: 32.7 g/dL (ref 31.5–35.7)
MCV: 89 fL (ref 79–97)
Platelets: 176 10*3/uL (ref 150–450)
RBC: 4.54 x10E6/uL (ref 3.77–5.28)
RDW: 12.7 % (ref 11.7–15.4)
WBC: 2.9 10*3/uL — ABNORMAL LOW (ref 3.4–10.8)

## 2021-04-28 LAB — HEMOGLOBIN A1C
Est. average glucose Bld gHb Est-mCnc: 100 mg/dL
Hgb A1c MFr Bld: 5.1 % (ref 4.8–5.6)

## 2021-04-28 LAB — LIPID PANEL
Chol/HDL Ratio: 2.6 ratio (ref 0.0–4.4)
Cholesterol, Total: 116 mg/dL (ref 100–199)
HDL: 44 mg/dL (ref >39)
LDL Chol Calc (NIH): 62 mg/dL (ref 0–99)
Triglycerides: 41 mg/dL (ref 0–149)
VLDL Cholesterol Cal: 10 mg/dL (ref 5–40)

## 2021-04-28 LAB — BASIC METABOLIC PANEL
BUN/Creatinine Ratio: 13 (ref 9–23)
BUN: 9 mg/dL (ref 6–24)
CO2: 24 mmol/L (ref 20–29)
Calcium: 9.1 mg/dL (ref 8.7–10.2)
Chloride: 105 mmol/L (ref 96–106)
Creatinine, Ser: 0.71 mg/dL (ref 0.57–1.00)
Glucose: 90 mg/dL (ref 70–99)
Potassium: 4 mmol/L (ref 3.5–5.2)
Sodium: 144 mmol/L (ref 134–144)
eGFR: 107 mL/min/{1.73_m2} (ref >59)

## 2021-04-28 LAB — TSH: TSH: 1.17 u[IU]/mL (ref 0.450–4.500)

## 2022-03-13 ENCOUNTER — Other Ambulatory Visit: Payer: Self-pay | Admitting: Family Medicine

## 2022-03-13 DIAGNOSIS — Z1231 Encounter for screening mammogram for malignant neoplasm of breast: Secondary | ICD-10-CM

## 2022-04-09 ENCOUNTER — Ambulatory Visit
Admission: RE | Admit: 2022-04-09 | Discharge: 2022-04-09 | Disposition: A | Discharge status: Home / Self Care | Payer: No Typology Code available for payment source | Source: Ambulatory Visit | Attending: Family Medicine | Admitting: Family Medicine

## 2022-04-09 DIAGNOSIS — Z1231 Encounter for screening mammogram for malignant neoplasm of breast: Secondary | ICD-10-CM | POA: Insufficient documentation

## 2022-05-01 ENCOUNTER — Encounter: Payer: Self-pay | Admitting: Obstetrics and Gynecology

## 2022-05-01 ENCOUNTER — Ambulatory Visit (INDEPENDENT_AMBULATORY_CARE_PROVIDER_SITE_OTHER): Payer: No Typology Code available for payment source | Admitting: Obstetrics and Gynecology

## 2022-05-01 ENCOUNTER — Other Ambulatory Visit (HOSPITAL_COMMUNITY)
Admission: RE | Admit: 2022-05-01 | Discharge: 2022-05-01 | Disposition: A | Discharge status: Home / Self Care | Payer: No Typology Code available for payment source | Source: Ambulatory Visit | Attending: Obstetrics and Gynecology | Admitting: Obstetrics and Gynecology

## 2022-05-01 VITALS — BP 143/85 | HR 48 | Ht 67.0 in | Wt 207.5 lb

## 2022-05-01 DIAGNOSIS — Z124 Encounter for screening for malignant neoplasm of cervix: Secondary | ICD-10-CM

## 2022-05-01 DIAGNOSIS — Z01419 Encounter for gynecological examination (general) (routine) without abnormal findings: Secondary | ICD-10-CM | POA: Diagnosis not present

## 2022-05-01 NOTE — Progress Notes (Signed)
Patients presents for annual exam today. She states continuing to have regular cycles, no birth control use at this time only condoms. Due for pap smear, ordered.Up to date on mammogram. Annual labs are deferred to PCP.

## 2022-05-01 NOTE — Progress Notes (Signed)
HPI:      [name] is a 47 y.o. G1P1001 who LMP was No LMP recorded.  Subjective:   She presents today for her annual examination.  She continues to lose weight.!!!!!!!!! continues diet and exercise. She is using condoms for birth control and likes this method. She has an area on her vulva that she would like an opinion regarding.  It occasionally gets in the way of her underwear she thinks it is a skin tag or a keloid.    Hx: The following portions of the patient's history were reviewed and updated as appropriate:             She  has a past medical history of Family history of breast cancer, Hemorrhoids, Hypertension, Obesity, Ovarian cyst, and Vaginal Pap smear, abnormal. She does not have any pertinent problems on file. She  has a past surgical history that includes Colposcopy w/ biopsy / curettage (10/2011) and Wrist surgery (Left). Her family history includes Breast cancer in her maternal aunt, maternal aunt, and maternal grandmother; Colon cancer in her maternal aunt; Diabetes in her father; Heart disease in her father and mother; Ovarian cancer in her maternal aunt. She  reports that she has never smoked. She has never used smokeless tobacco. She reports current alcohol use. She reports that she does not use drugs. She has a current medication list which includes the following prescription(s): amlodipine, multi-vitamins, nystatin ointment, nystatin-triamcinolone, and triamcinolone ointment. She has No Known Allergies.       Review of Systems:  Review of Systems  Constitutional: Denied constitutional symptoms, night sweats, recent illness, fatigue, fever, insomnia and weight loss.  Eyes: Denied eye symptoms, eye pain, photophobia, vision change and visual disturbance.  Ears/Nose/Throat/Neck: Denied ear, nose, throat or neck symptoms, hearing loss, nasal discharge, sinus congestion and sore throat.  Cardiovascular: Denied cardiovascular symptoms, arrhythmia, chest  pain/pressure, edema, exercise intolerance, orthopnea and palpitations.  Respiratory: Denied pulmonary symptoms, asthma, pleuritic pain, productive sputum, cough, dyspnea and wheezing.  Gastrointestinal: Denied, gastro-esophageal reflux, melena, nausea and vomiting.  Genitourinary: See HPI for additional information.  Musculoskeletal: Denied musculoskeletal symptoms, stiffness, swelling, muscle weakness and myalgia.  Dermatologic: Denied dermatology symptoms, rash and scar.  Neurologic: Denied neurology symptoms, dizziness, headache, neck pain and syncope.  Psychiatric: Denied psychiatric symptoms, anxiety and depression.  Endocrine: Denied endocrine symptoms including hot flashes and night sweats.   Meds:   Current Outpatient Medications on File Prior to Visit  Medication Sig Dispense Refill   amLODipine (NORVASC) 5 MG tablet Take 5 mg by mouth daily.     Multiple Vitamin (MULTI-VITAMINS) TABS Take by mouth.     nystatin ointment (MYCOSTATIN) Apply 1 application topically 2 (two) times daily. 30 g 0   nystatin-triamcinolone (MYCOLOG II) cream Apply 1 application topically 2 (two) times daily. For 10-14 days 60 g 1   triamcinolone ointment (KENALOG) 0.1 % Apply 1 application topically 2 (two) times daily. 30 g 0   No current facility-administered medications on file prior to visit.     Objective:     Vitals:   05/01/22 0927  BP: (!) 142/87  Pulse: (!) 48    Filed Weights   05/01/22 0927  Weight: 207 lb 8 oz (94.1 kg)              Physical examination General NAD, Conversant  HEENT Atraumatic; Op clear with mmm.  Normo-cephalic. Pupils reactive. Anicteric sclerae  Thyroid/Neck Smooth without nodularity or enlargement. Normal ROM.  Neck Supple.  Skin No rashes, lesions or ulceration. Normal palpated skin turgor. No nodularity.  Breasts: Declined  Lungs: Clear to auscultation.No rales or wheezes. Normal Respiratory effort, no retractions.  Heart: NSR.  No murmurs or rubs  appreciated. No peripheral edema  Abdomen: Soft.  Non-tender.  No masses.  No HSM. No hernia  Extremities: Moves all appropriately.  Normal ROM for age. No lymphadenopathy.  Neuro: Oriented to PPT.  Normal mood. Normal affect.     Pelvic:   Vulva: Normal appearance.  No lesions.  Large right labial skin tag.  Vagina: No lesions or abnormalities noted.  Support: Normal pelvic support.  Urethra No masses tenderness or scarring.  Meatus Normal size without lesions or prolapse.  Cervix: Normal appearance.  No lesions.  Anus: Normal exam.  No lesions.  Perineum: Normal exam.  No lesions.        Bimanual   Uterus: Normal size.  Non-tender.  Mobile.  AV.  Adnexae: No masses.  Non-tender to palpation.  Cul-de-sac: Negative for abnormality.     Assessment:    G1P1001 Patient Active Problem List   Diagnosis Date Noted   Monilia infection 01/22/2017   Morbid obesity with BMI of 60.0-69.9, adult 01/22/2017   Essential (primary) hypertension 01/06/2015     1. Well woman exam with routine gynecological exam   2. Cervical cancer screening     Labial skin tag   Plan:            1.  Basic Screening Recommendations The basic screening recommendations for asymptomatic women were discussed with the patient during her visit.  The age-appropriate recommendations were discussed with her and the rational for the tests reviewed.  When I am informed by the patient that another primary care physician has previously obtained the age-appropriate tests and they are up-to-date, only outstanding tests are ordered and referrals given as necessary.  Abnormal results of tests will be discussed with her when all of her results are completed.  Routine preventative health maintenance measures emphasized: Exercise/Diet/Weight control, Tobacco Warnings, Alcohol/Substance use risks and Stress Management Pap performed 2.  We discussed the options of removal of her skin tag.  I would consider doing this in the  office with local anesthesia and LEEP machine.  Patient will inform us if she desires this Orders No orders of the defined types were placed in this encounter.   No orders of the defined types were placed in this encounter.         F/U  Return in about 1 year (around 05/01/2023) for Annual Physical.  Elonda Husky, M.D. 05/01/2022 9:42 AM

## 2022-05-03 LAB — CYTOLOGY - PAP
Comment: NEGATIVE
Diagnosis: NEGATIVE
High risk HPV: NEGATIVE

## 2022-05-31 ENCOUNTER — Encounter: Payer: Self-pay | Admitting: Family Medicine

## 2022-06-04 ENCOUNTER — Inpatient Hospital Stay: Payer: No Typology Code available for payment source | Admitting: Internal Medicine

## 2022-06-04 ENCOUNTER — Inpatient Hospital Stay: Payer: No Typology Code available for payment source

## 2022-06-07 ENCOUNTER — Inpatient Hospital Stay: Payer: No Typology Code available for payment source | Attending: Internal Medicine | Admitting: Internal Medicine

## 2022-06-07 ENCOUNTER — Inpatient Hospital Stay: Payer: No Typology Code available for payment source

## 2022-06-07 ENCOUNTER — Encounter: Payer: Self-pay | Admitting: Internal Medicine

## 2022-06-07 VITALS — BP 157/88 | HR 66 | Temp 98.0°F | Ht 67.0 in | Wt 205.4 lb

## 2022-06-07 DIAGNOSIS — Z803 Family history of malignant neoplasm of breast: Secondary | ICD-10-CM | POA: Insufficient documentation

## 2022-06-07 DIAGNOSIS — Z9884 Bariatric surgery status: Secondary | ICD-10-CM | POA: Diagnosis not present

## 2022-06-07 DIAGNOSIS — D709 Neutropenia, unspecified: Secondary | ICD-10-CM | POA: Diagnosis present

## 2022-06-07 DIAGNOSIS — Z8041 Family history of malignant neoplasm of ovary: Secondary | ICD-10-CM | POA: Insufficient documentation

## 2022-06-07 DIAGNOSIS — D708 Other neutropenia: Secondary | ICD-10-CM | POA: Insufficient documentation

## 2022-06-07 DIAGNOSIS — R634 Abnormal weight loss: Secondary | ICD-10-CM | POA: Insufficient documentation

## 2022-06-07 DIAGNOSIS — Z800 Family history of malignant neoplasm of digestive organs: Secondary | ICD-10-CM | POA: Insufficient documentation

## 2022-06-07 NOTE — Assessment & Plan Note (Addendum)
#   Intermittent leukopenia/neutropenia- absolute neutrophil count -1.3 [MAY 2024]; WBC 2.9-3.0;normal hemoglobin/platelets. Patient is asymptomatic-no increased risk of infections. Suspect benign causes rather than any malignant causes.  No high risk factors for HIV/hepatitis.  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 No evidence of hepatosplenomegaly or liver disease.  # At patient currently asymptomatic recommend holding off any further workup at this time.  Will repeat labs in 6 months if worsening/symptomatic would recommend further workup including imaging for cirrhosis/liver disease; bone marrow biopsy etc.  # History of gastric bypass [190 pound weight loss]-congratulated patient on weight loss.  However patient inconsistent with her multivitamins.  Recommend compliance.  Will check B12 folic acid iron studies ferritin-in 6 months next labs.  Thank you Dr.Linthavong for allowing me to participate in the care of your pleasant patient. Please do not hesitate to contact me with questions or concerns in the interim.  # DISPOSITION: # labs- none # follow up in 6 months- MD; 2-3 days PRIOR- labs- cbc/cmp; Vit D 25-OH; iron  studies; B12 levels- -Dr.B

## 2022-06-07 NOTE — Progress Notes (Signed)
[facility] CONSULT NOTE  Charlotte Burgess Care Team: Marisue Ivan, MD as PCP - General (Family Medicine)  CHIEF COMPLAINTS/PURPOSE OF CONSULTATION: Leucopenia  # LEUCOPENIA/NEUTROPENIA- ANC; Hb; platelets; CT Ab/US; Hepatitis/HIV; Alcohol  #  Oncology History   No history exists.   DEC 2022- Sleeve; followed repeat surgery  by [facility] [180 pounds weight loss]  HISTORY OF PRESENTING ILLNESS: Charlotte Burgess ambulating-independently.  Alone.   Charlotte Burgess 47 y.o.  female is here for further evaluation leucopenia.   Charlotte Burgess noted to have intermittent low blood counts/white count.  Denies any new medications.  Denies any worsening fatigue or unintentional weight loss.  Charlotte Burgess also verbalized importance of gastric bypass surgery.  Charlotte Burgess denies being tired.  Charlotte Burgess has been working at [employer].  Denies any alcohol abuse.  Denies adverse factors for HIV/hepatitis.  Frequent infections [pneumonias/sinus infection/UTI]- none   Skin rash:  none  Review of Systems  Constitutional:  Negative for chills, diaphoresis, fever, malaise/fatigue and weight loss.  HENT:  Negative for nosebleeds and sore throat.   Eyes:  Negative for double vision.  Respiratory:  Negative for cough, hemoptysis, sputum production, shortness of breath and wheezing.   Cardiovascular:  Negative for chest pain, palpitations, orthopnea and leg swelling.  Gastrointestinal:  Negative for abdominal pain, blood in stool, constipation, diarrhea, heartburn, melena, nausea and vomiting.  Genitourinary:  Negative for dysuria, frequency and urgency.  Musculoskeletal:  Negative for back pain and joint pain.  Skin: Negative.  Negative for itching and rash.  Neurological:  Negative for dizziness, tingling, focal weakness, weakness and headaches.  Endo/Heme/Allergies:  Does not bruise/bleed easily.  Psychiatric/Behavioral:  Negative for depression. Charlotte Burgess is not nervous/anxious and does not have insomnia.      MEDICAL  HISTORY:  Past Medical History:  Diagnosis Date   Family history of breast cancer    Hemorrhoids    Hypertension    chronic   Obesity    Ovarian cyst    Vaginal Pap smear, abnormal    ascus/pos    SURGICAL HISTORY: Past Surgical History:  Procedure Laterality Date   COLPOSCOPY W/ BIOPSY / CURETTAGE  10/2011   ecc- neg cin 1 on bx   WRIST SURGERY Left     SOCIAL HISTORY: Social History   Socioeconomic History   Marital status: Single    Spouse name: Not on file   Number of children: Not on file   Years of education: Not on file   Highest education level: Not on file  Occupational History   Not on file  Tobacco Use   Smoking status: Never   Smokeless tobacco: Never  Vaping Use   Vaping Use: Never used  Substance and Sexual Activity   Alcohol use: Yes    Comment: occas   Drug use: No   Sexual activity: Yes    Birth control/protection: Condom  Other Topics Concern   Not on file  Social History Narrative   Not on file   Social Determinants of Health   Financial Resource Strain: Not on file  Food Insecurity: No Food Insecurity (06/07/2022)   Hunger Vital Sign    Worried About Running Out of Food in [place] Last Year: Never true    Ran Out of Food in [place] Last Year: Never true  Transportation Needs: No Transportation Needs (06/07/2022)   PRAPARE - Administrator, Civil Service (Medical): No    Lack of Transportation (Non-Medical): No  Physical Activity: Inactive (01/22/2017)   Exercise Vital  Sign    Days of Exercise per Week: 0 days    Minutes of Exercise per Session: 0 min  Stress: Not on file  Social Connections: Not on file  Intimate Partner Violence: Not At Risk (06/07/2022)   Humiliation, Afraid, Rape, and Kick questionnaire    Fear of Current or Ex-Partner: No    Emotionally Abused: No    Physically Abused: No    Sexually Abused: No    FAMILY HISTORY: Family History  Problem Relation Age of Onset   Heart disease Mother    Heart disease  Father    Diabetes Father    Colon cancer Maternal Aunt    Ovarian cancer Maternal Aunt    Breast cancer Maternal Aunt    Breast cancer Maternal Aunt        not 100% sure   Breast cancer Maternal Grandmother     ALLERGIES:  has No Known Allergies.  MEDICATIONS:  Current Outpatient Medications  Medication Sig Dispense Refill   amLODipine (NORVASC) 5 MG tablet Take 5 mg by mouth daily.     Multiple Vitamin (MULTI-VITAMINS) TABS Take by mouth.     nystatin ointment (MYCOSTATIN) Apply 1 application topically 2 (two) times daily. 30 g 0   nystatin-triamcinolone (MYCOLOG II) cream Apply 1 application topically 2 (two) times daily. For 10-14 days 60 g 1   triamcinolone ointment (KENALOG) 0.1 % Apply 1 application topically 2 (two) times daily. 30 g 0   No current facility-administered medications for this visit.    PHYSICAL EXAMINATION:  Vitals:   06/07/22 1054  BP: (!) 157/88  Pulse: 66  Temp: 98 F (36.7 C)  SpO2: 100%   Filed Weights   06/07/22 1054  Weight: 205 lb 6.4 oz (93.2 kg)    Physical Exam Vitals and nursing note reviewed.  HENT:     Head: Normocephalic and atraumatic.     Mouth/Throat:     Pharynx: Oropharynx is clear.  Eyes:     Extraocular Movements: Extraocular movements intact.     Pupils: Pupils are equal, round, and reactive to light.  Cardiovascular:     Rate and Rhythm: Normal rate and regular rhythm.  Pulmonary:     Comments: Decreased breath sounds bilaterally.  Abdominal:     Palpations: Abdomen is soft.  Musculoskeletal:        General: Normal range of motion.     Cervical back: Normal range of motion.  Skin:    General: Skin is warm.  Neurological:     General: No focal deficit present.     Mental Status: Charlotte Burgess is alert and oriented to person, place, and time.  Psychiatric:        Behavior: Behavior normal.        Judgment: Judgment normal.     LABORATORY DATA:  I have reviewed Charlotte data as listed Lab Results  Component Value Date    WBC 2.9 (L) 04/27/2021   HGB 13.2 04/27/2021   HCT 40.4 04/27/2021   MCV 89 04/27/2021   PLT 176 04/27/2021   No results for input(s): "NA", "K", "CL", "CO2", "GLUCOSE", "BUN", "CREATININE", "CALCIUM", "GFRNONAA", "GFRAA", "PROT", "ALBUMIN", "AST", "ALT", "ALKPHOS", "BILITOT", "BILIDIR", "IBILI" in Charlotte last 8760 hours.  RADIOGRAPHIC STUDIES: I have personally reviewed Charlotte radiological images as listed and agreed with Charlotte findings in Charlotte report. No results found.  ASSESSMENT & PLAN:   Other neutropenia (HCC) # Intermittent leukopenia/neutropenia- absolute neutrophil count -1.3 [MAY 2024]; WBC 2.9-3.0;normal hemoglobin/platelets. Charlotte Burgess is asymptomatic-no increased  risk of infections. Suspect benign causes rather than any malignant causes.  No high risk factors for HIV/hepatitis.  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 No evidence of hepatosplenomegaly or liver disease.  # At Charlotte Burgess currently asymptomatic recommend holding off any further workup at this time.  Will repeat labs in 6 months if worsening/symptomatic would recommend further workup including imaging for cirrhosis/liver disease; bone marrow biopsy etc.  # History of gastric bypass [190 pound weight loss]-congratulated Charlotte Burgess on weight loss.  However Charlotte Burgess inconsistent with Charlotte Burgess multivitamins.  Recommend compliance.  Will check B12 folic acid iron studies ferritin-in 6 months next labs.  Thank you Dr.Linthavong for allowing me to participate in [place] care of your pleasant Charlotte Burgess. Please do not hesitate to contact me with questions or concerns in [place] interim.  # DISPOSITION: # labs- none # follow up in 6 months- MD; 2-3 days PRIOR- labs- cbc/cmp; Vit D 25-OH; iron  studies; B12 levels- -Dr.B  All questions were answered. Charlotte Burgess knows to call [place] clinic with any problems, questions or concerns.     Earna Coder, MD 06/07/2022 12:09 PM

## 2022-06-07 NOTE — Progress Notes (Signed)
Had SADIS procedure 12/22.

## 2022-09-11 ENCOUNTER — Emergency Department
Admission: EM | Admit: 2022-09-11 | Discharge: 2022-09-11 | Disposition: A | Discharge status: Home / Self Care | Payer: 59 | Attending: Student in an Organized Health Care Education/Training Program | Admitting: Student in an Organized Health Care Education/Training Program

## 2022-09-11 ENCOUNTER — Emergency Department: Payer: 59

## 2022-09-11 ENCOUNTER — Other Ambulatory Visit: Payer: Self-pay

## 2022-09-11 DIAGNOSIS — M545 Low back pain, unspecified: Secondary | ICD-10-CM | POA: Insufficient documentation

## 2022-09-11 DIAGNOSIS — M542 Cervicalgia: Secondary | ICD-10-CM | POA: Diagnosis not present

## 2022-09-11 DIAGNOSIS — M25561 Pain in right knee: Secondary | ICD-10-CM | POA: Insufficient documentation

## 2022-09-11 MED ORDER — KETOROLAC TROMETHAMINE 15 MG/ML IJ SOLN
15.0000 mg | Freq: Once | INTRAMUSCULAR | Status: AC
  Administered 2022-09-11: 15 mg via INTRAMUSCULAR
  Filled 2022-09-11: qty 1

## 2022-09-11 MED ORDER — LIDOCAINE 5 % EX PTCH
2.0000 | MEDICATED_PATCH | CUTANEOUS | Status: DC
  Administered 2022-09-11: 2 via TRANSDERMAL
  Filled 2022-09-11: qty 2

## 2022-09-11 MED ORDER — CYCLOBENZAPRINE HCL 10 MG PO TABS
5.0000 mg | ORAL_TABLET | Freq: Once | ORAL | Status: AC
  Administered 2022-09-11: 5 mg via ORAL
  Filled 2022-09-11: qty 1

## 2022-09-11 MED ORDER — CYCLOBENZAPRINE HCL 5 MG PO TABS: 5.0000 mg | ORAL_TABLET | Freq: Three times a day (TID) | ORAL | 0 refills | Status: DC | PRN
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
## 2022-10-25 ENCOUNTER — Ambulatory Visit: Payer: 59

## 2022-10-25 DIAGNOSIS — Z1211 Encounter for screening for malignant neoplasm of colon: Secondary | ICD-10-CM | POA: Diagnosis present

## 2022-10-25 DIAGNOSIS — K640 First degree hemorrhoids: Secondary | ICD-10-CM | POA: Diagnosis not present

## 2022-12-04 ENCOUNTER — Inpatient Hospital Stay: Payer: 59 | Attending: Internal Medicine

## 2022-12-04 DIAGNOSIS — Z9884 Bariatric surgery status: Secondary | ICD-10-CM | POA: Diagnosis not present

## 2022-12-04 DIAGNOSIS — Z800 Family history of malignant neoplasm of digestive organs: Secondary | ICD-10-CM | POA: Insufficient documentation

## 2022-12-04 DIAGNOSIS — D709 Neutropenia, unspecified: Secondary | ICD-10-CM | POA: Insufficient documentation

## 2022-12-04 DIAGNOSIS — R634 Abnormal weight loss: Secondary | ICD-10-CM | POA: Diagnosis not present

## 2022-12-04 DIAGNOSIS — Z8041 Family history of malignant neoplasm of ovary: Secondary | ICD-10-CM | POA: Diagnosis not present

## 2022-12-04 DIAGNOSIS — D708 Other neutropenia: Secondary | ICD-10-CM

## 2022-12-04 DIAGNOSIS — Z803 Family history of malignant neoplasm of breast: Secondary | ICD-10-CM | POA: Diagnosis not present

## 2022-12-04 LAB — CBC WITH DIFFERENTIAL (CANCER CENTER ONLY)
Abs Immature Granulocytes: 0 10*3/uL (ref 0.00–0.07)
Basophils Absolute: 0 10*3/uL (ref 0.0–0.1)
Basophils Relative: 1 %
Eosinophils Absolute: 0.1 10*3/uL (ref 0.0–0.5)
Eosinophils Relative: 2 %
HCT: 41.6 % (ref 36.0–46.0)
Hemoglobin: 13.4 g/dL (ref 12.0–15.0)
Immature Granulocytes: 0 %
Lymphocytes Relative: 37 %
Lymphs Abs: 1 10*3/uL (ref 0.7–4.0)
MCH: 29.5 pg (ref 26.0–34.0)
MCHC: 32.2 g/dL (ref 30.0–36.0)
MCV: 91.4 fL (ref 80.0–100.0)
Monocytes Absolute: 0.2 10*3/uL (ref 0.1–1.0)
Monocytes Relative: 6 %
Neutro Abs: 1.4 10*3/uL — ABNORMAL LOW (ref 1.7–7.7)
Neutrophils Relative %: 54 %
Platelet Count: 174 10*3/uL (ref 150–400)
RBC: 4.55 MIL/uL (ref 3.87–5.11)
RDW: 12.5 % (ref 11.5–15.5)
WBC Count: 2.7 10*3/uL — ABNORMAL LOW (ref 4.0–10.5)
nRBC: 0 % (ref 0.0–0.2)

## 2022-12-04 LAB — CMP (CANCER CENTER ONLY)
ALT: 50 U/L — ABNORMAL HIGH (ref 0–44)
AST: 64 U/L — ABNORMAL HIGH (ref 15–41)
Albumin: 4.1 g/dL (ref 3.5–5.0)
Alkaline Phosphatase: 112 U/L (ref 38–126)
Anion gap: 8 (ref 5–15)
BUN: 11 mg/dL (ref 6–20)
CO2: 26 mmol/L (ref 22–32)
Calcium: 8.9 mg/dL (ref 8.9–10.3)
Chloride: 104 mmol/L (ref 98–111)
Creatinine: 0.79 mg/dL (ref 0.44–1.00)
GFR, Estimated: >60 mL/min (ref >60)
Glucose, Bld: 91 mg/dL (ref 70–99)
Potassium: 4 mmol/L (ref 3.5–5.1)
Sodium: 138 mmol/L (ref 135–145)
Total Bilirubin: 0.7 mg/dL (ref <1.2)
Total Protein: 7.3 g/dL (ref 6.5–8.1)

## 2022-12-04 LAB — VITAMIN D 25 HYDROXY (VIT D DEFICIENCY, FRACTURES): Vit D, 25-Hydroxy: 53.04 ng/mL (ref 30–100)

## 2022-12-04 LAB — IRON AND TIBC
Iron: 88 ug/dL (ref 28–170)
Saturation Ratios: 25 % (ref 10.4–31.8)
TIBC: 351 ug/dL (ref 250–450)
UIBC: 263 ug/dL

## 2022-12-04 LAB — VITAMIN B12: Vitamin B-12: 1280 pg/mL — ABNORMAL HIGH (ref 180–914)

## 2022-12-04 LAB — FERRITIN: Ferritin: 27 ng/mL (ref 11–307)

## 2022-12-06 ENCOUNTER — Inpatient Hospital Stay: Payer: 59 | Admitting: Internal Medicine

## 2022-12-06 ENCOUNTER — Encounter: Payer: Self-pay | Admitting: Internal Medicine

## 2022-12-06 VITALS — BP 130/87 | HR 59 | Temp 97.7°F | Ht 67.0 in | Wt 192.8 lb

## 2022-12-06 DIAGNOSIS — D708 Other neutropenia: Secondary | ICD-10-CM | POA: Diagnosis not present

## 2022-12-06 DIAGNOSIS — D709 Neutropenia, unspecified: Secondary | ICD-10-CM | POA: Diagnosis not present

## 2022-12-06 NOTE — Progress Notes (Signed)
[facility] CONSULT NOTE  Patient Care Team: Marisue Ivan, MD as PCP - General (Family Medicine) Earna Coder, MD as Consulting Physician (Oncology)  CHIEF COMPLAINTS/PURPOSE OF CONSULTATION: Leucopenia  # LEUCOPENIA/NEUTROPENIA- ANC; Hb; platelets; CT Ab/US; Hepatitis/HIV; Alcohol  #  Oncology History   No history exists.   DEC 2022- Sleeve; followed repeat surgery  by [facility] [180 pounds weight loss]  HISTORY OF PRESENTING ILLNESS: Patient ambulating-independently.  Alone.   Charlotte Burgess 47 y.o.  female with history gastric bypass surgery and mild leucopenia/neutropenia is here for follow-up.  Continues to deny any frequent skin infections or sinus or pulmonary infections.  She continues to be physically active.  Denies any fatigue or fevers or chills.  Review of Systems  Constitutional:  Negative for chills, diaphoresis, fever, malaise/fatigue and weight loss.  HENT:  Negative for nosebleeds and sore throat.   Eyes:  Negative for double vision.  Respiratory:  Negative for cough, hemoptysis, sputum production, shortness of breath and wheezing.   Cardiovascular:  Negative for chest pain, palpitations, orthopnea and leg swelling.  Gastrointestinal:  Negative for abdominal pain, blood in stool, constipation, diarrhea, heartburn, melena, nausea and vomiting.  Genitourinary:  Negative for dysuria, frequency and urgency.  Musculoskeletal:  Negative for back pain and joint pain.  Skin: Negative.  Negative for itching and rash.  Neurological:  Negative for dizziness, tingling, focal weakness, weakness and headaches.  Endo/Heme/Allergies:  Does not bruise/bleed easily.  Psychiatric/Behavioral:  Negative for depression. The patient is not nervous/anxious and does not have insomnia.      MEDICAL HISTORY:  Past Medical History:  Diagnosis Date   Family history of breast cancer    Hemorrhoids    Hypertension    chronic   Obesity    Ovarian cyst     Vaginal Pap smear, abnormal    ascus/pos    SURGICAL HISTORY: Past Surgical History:  Procedure Laterality Date   COLPOSCOPY W/ BIOPSY / CURETTAGE  10/2011   ecc- neg cin 1 on bx   WRIST SURGERY Left     SOCIAL HISTORY: Social History   Socioeconomic History   Marital status: Single    Spouse name: Not on file   Number of children: Not on file   Years of education: Not on file   Highest education level: Not on file  Occupational History   Not on file  Tobacco Use   Smoking status: Never   Smokeless tobacco: Never  Vaping Use   Vaping status: Never Used  Substance and Sexual Activity   Alcohol use: Yes    Comment: occas   Drug use: No   Sexual activity: Yes    Birth control/protection: Condom  Other Topics Concern   Not on file  Social History Narrative   Not on file   Social Determinants of Health   Financial Resource Strain: Medium Risk (09/18/2022)   Received from [facility]   Overall Financial Resource Strain (CARDIA)    Difficulty of Paying Living Expenses: Somewhat hard  Food Insecurity: No Food Insecurity (09/18/2022)   Received from [facility]   Hunger Vital Sign    Worried About Running Out of Food in the Last Year: Never true    Ran Out of Food in the Last Year: Never true  Transportation Needs: No Transportation Needs (09/18/2022)   Received from [facility] - Transportation    In the past 12 months, has lack of transportation  kept you from medical appointments or from getting medications?: No    Lack of Transportation (Non-Medical): No  Physical Activity: Inactive (01/22/2017)   Exercise Vital Sign    Days of Exercise per Week: 0 days    Minutes of Exercise per Session: 0 min  Stress: Not on file  Social Connections: Not on file  Intimate Partner Violence: Not At Risk (06/07/2022)   Humiliation, Afraid, Rape, and Kick questionnaire    Fear of Current or Ex-Partner: No    Emotionally  Abused: No    Physically Abused: No    Sexually Abused: No    FAMILY HISTORY: Family History  Problem Relation Age of Onset   Heart disease Mother    Heart disease Father    Diabetes Father    Colon cancer Maternal Aunt    Ovarian cancer Maternal Aunt    Breast cancer Maternal Aunt    Breast cancer Maternal Aunt        not 100% sure   Breast cancer Maternal Grandmother     ALLERGIES:  has No Known Allergies.  MEDICATIONS:  Current Outpatient Medications  Medication Sig Dispense Refill   amLODipine (NORVASC) 5 MG tablet Take 5 mg by mouth daily.     cyclobenzaprine (FLEXERIL) 5 MG tablet Take 1 tablet (5 mg total) by mouth 3 (three) times daily as needed for muscle spasms. 30 tablet 0   Multiple Vitamin (MULTI-VITAMINS) TABS Take by mouth.     nystatin ointment (MYCOSTATIN) Apply 1 application topically 2 (two) times daily. 30 g 0   nystatin-triamcinolone (MYCOLOG II) cream Apply 1 application topically 2 (two) times daily. For 10-14 days 60 g 1   triamcinolone ointment (KENALOG) 0.1 % Apply 1 application topically 2 (two) times daily. 30 g 0   No current facility-administered medications for this visit.    PHYSICAL EXAMINATION:  Vitals:   12/06/22 1019  BP: 130/87  Pulse: (!) 59  Temp: 97.7 F (36.5 C)  SpO2: 100%   Filed Weights   12/06/22 1019  Weight: 192 lb 12.8 oz (87.5 kg)    Physical Exam Vitals and nursing note reviewed.  HENT:     Head: Normocephalic and atraumatic.     Mouth/Throat:     Pharynx: Oropharynx is clear.  Eyes:     Extraocular Movements: Extraocular movements intact.     Pupils: Pupils are equal, round, and reactive to light.  Cardiovascular:     Rate and Rhythm: Normal rate and regular rhythm.  Pulmonary:     Comments: Decreased breath sounds bilaterally.  Abdominal:     Palpations: Abdomen is soft.  Musculoskeletal:        General: Normal range of motion.     Cervical back: Normal range of motion.  Skin:    General: Skin is  warm.  Neurological:     General: No focal deficit present.     Mental Status: She is alert and oriented to person, place, and time.  Psychiatric:        Behavior: Behavior normal.        Judgment: Judgment normal.     LABORATORY DATA:  I have reviewed the data as listed Lab Results  Component Value Date   WBC 2.7 (L) 12/04/2022   HGB 13.4 12/04/2022   HCT 41.6 12/04/2022   MCV 91.4 12/04/2022   PLT 174 12/04/2022   Recent Labs    12/04/22 0849  NA 138  K 4.0  CL 104  CO2 26  GLUCOSE  91  BUN 11  CREATININE 0.79  CALCIUM 8.9  GFRNONAA >60  PROT 7.3  ALBUMIN 4.1  AST 64*  ALT 50*  ALKPHOS 112  BILITOT 0.7    RADIOGRAPHIC STUDIES: I have personally reviewed the radiological images as listed and agreed with the findings in the report. No results found.  ASSESSMENT & PLAN:   Other neutropenia (HCC) # Intermittent leukopenia/neutropenia- absolute neutrophil count -1.3 [MAY 2024]; WBC 2.9-3.0;normal hemoglobin/platelets. Patient is asymptomatic-no increased risk of infections. Suspect benign causes rather than any malignant causes.  No high risk factors for HIV/hepatitis.  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 No evidence of hepatosplenomegaly or liver disease.  # At patient currently asymptomatic recommend holding off any further workup at this time.  Will repeat labs in 6 months if worsening/symptomatic would recommend further workup including imaging for cirrhosis/liver disease; bone marrow biopsy etc.  # History of gastric bypass [190 pound weight loss]-congratulated patient on weight loss.  However patient inconsistent with her multivitamins.  Recommend compliance.  Will check B12 folic acid iron studies ferritin-in 6 months next labs.  #   # DISPOSITION: # follow up in 6 months- MD; 2-3 days PRIOR- labs- cbc/cmp;Zinc, copper levels-   -Dr.B   All questions were answered. The patient knows to call the clinic with any problems, questions or concerns.    Earna Coder, MD 12/06/2022 12:27 PM
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
## 2022-12-06 NOTE — Assessment & Plan Note (Addendum)
#   Intermittent leukopenia/neutropenia- absolute neutrophil count -1.3 [MAY 2024]; WBC 2.9-3.0;normal hemoglobin/platelets. Patient is asymptomatic-no increased risk of infections. Suspect benign causes-benign ethnic neutropenia rather than any other causes or malignant causes.  No high risk factors for HIV/hepatitis.  # This patient is clinically stable recommend holding off any further workup at this time.  Will recheck again in 6 months.-Including copper and zinc levels.  If patient is clinically stable/blood counts not any worsening- she will be discharged to follow-up with PCP.  Patient agreement.  # History of gastric bypass [190 pound weight loss]-congratulated patient on weight loss.  However patient inconsistent with her multivitamins.  Recommend compliance.  B12 normal.  Iron studies within normal limits.  # DISPOSITION: # follow up in 6 months- MD; 2-3 days PRIOR- labs- cbc/cmp;Zinc, copper levels-  -Dr.B

## 2023-03-08 ENCOUNTER — Other Ambulatory Visit: Payer: Self-pay | Admitting: Family Medicine

## 2023-03-08 DIAGNOSIS — Z1231 Encounter for screening mammogram for malignant neoplasm of breast: Secondary | ICD-10-CM

## 2023-04-12 ENCOUNTER — Ambulatory Visit
Admission: RE | Admit: 2023-04-12 | Discharge: 2023-04-12 | Disposition: A | Discharge status: Home / Self Care | Payer: 59 | Source: Ambulatory Visit | Attending: Family Medicine | Admitting: Family Medicine

## 2023-04-12 DIAGNOSIS — Z1231 Encounter for screening mammogram for malignant neoplasm of breast: Secondary | ICD-10-CM | POA: Insufficient documentation

## 2023-05-22 ENCOUNTER — Encounter: Payer: Self-pay | Admitting: Obstetrics and Gynecology

## 2023-05-22 ENCOUNTER — Ambulatory Visit (INDEPENDENT_AMBULATORY_CARE_PROVIDER_SITE_OTHER): Admitting: Obstetrics and Gynecology

## 2023-05-22 VITALS — BP 134/88 | HR 58 | Ht 67.0 in | Wt 200.4 lb

## 2023-05-22 DIAGNOSIS — N9089 Other specified noninflammatory disorders of vulva and perineum: Secondary | ICD-10-CM

## 2023-05-22 DIAGNOSIS — Z01419 Encounter for gynecological examination (general) (routine) without abnormal findings: Secondary | ICD-10-CM | POA: Diagnosis not present

## 2023-05-22 NOTE — Progress Notes (Signed)
 HPI:      [name] is a 48 y.o. G1P1001 who LMP was Patient's last menstrual period was 05/13/2023.  Subjective:   She presents today for her annual examination.  She reports that she has undergone significant weight loss, so much so that she has been able to get off of the blood pressure medications.  She is understandably excited about this. She does state that she continues to have issues with a skin tag on her labia and now would like it removed.    Hx: The following portions of the patient's history were reviewed and updated as appropriate:             She  has a past medical history of Family history of breast cancer, Hemorrhoids, Hypertension, Obesity, Ovarian cyst, and Vaginal Pap smear, abnormal. She does not have any pertinent problems on file. She  has a past surgical history that includes Colposcopy w/ biopsy / curettage (10/2011) and Wrist surgery (Left). Her family history includes Breast cancer in her maternal aunt, maternal aunt, and maternal grandmother; Colon cancer in her maternal aunt; Diabetes in her father; Heart disease in her father and mother; Ovarian cancer in her maternal aunt. She  reports that she has never smoked. She has never used smokeless tobacco. She reports current alcohol use. She reports that she does not use drugs. She has a current medication list which includes the following prescription(s): multi-vitamins and triamcinolone  ointment. She has no known allergies.       Review of Systems:  Review of Systems  Constitutional: Denied constitutional symptoms, night sweats, recent illness, fatigue, fever, insomnia and weight loss.  Eyes: Denied eye symptoms, eye pain, photophobia, vision change and visual disturbance.  Ears/Nose/Throat/Neck: Denied ear, nose, throat or neck symptoms, hearing loss, nasal discharge, sinus congestion and sore throat.  Cardiovascular: Denied cardiovascular symptoms, arrhythmia, chest pain/pressure, edema, exercise  intolerance, orthopnea and palpitations.  Respiratory: Denied pulmonary symptoms, asthma, pleuritic pain, productive sputum, cough, dyspnea and wheezing.  Gastrointestinal: Denied, gastro-esophageal reflux, melena, nausea and vomiting.  Genitourinary: See HPI for additional information.  Musculoskeletal: Denied musculoskeletal symptoms, stiffness, swelling, muscle weakness and myalgia.  Dermatologic: Denied dermatology symptoms, rash and scar.  Neurologic: Denied neurology symptoms, dizziness, headache, neck pain and syncope.  Psychiatric: Denied psychiatric symptoms, anxiety and depression.  Endocrine: Denied endocrine symptoms including hot flashes and night sweats.   Meds:   Current Outpatient Medications on File Prior to Visit  Medication Sig Dispense Refill   Multiple Vitamin (MULTI-VITAMINS) TABS Take by mouth.     triamcinolone  ointment (KENALOG ) 0.1 % Apply 1 application topically 2 (two) times daily. 30 g 0   No current facility-administered medications on file prior to visit.     Objective:     Vitals:   05/22/23 0907  BP: 134/88  Pulse: (!) 58    Filed Weights   05/22/23 0907  Weight: 200 lb 6.4 oz (90.9 kg)              She has declined examination today as she is not due for a Pap smear.   Vulvar examination reveals a right labial skin tag.  Assessment:    G1P1001 Patient Active Problem List   Diagnosis Date Noted   Other neutropenia (HCC) 06/07/2022   Monilia infection 01/22/2017   Morbid obesity with BMI of 60.0-69.9, adult (HCC) 01/22/2017   Essential (primary) hypertension 01/06/2015     1. Well woman exam with routine gynecological exam   2. Labial  skin tag     Normal exam   Plan:            1.  Basic Screening Recommendations The basic screening recommendations for asymptomatic women were discussed with the patient during her visit.  The age-appropriate recommendations were discussed with her and the rational for the tests reviewed.  When  I am informed by the patient that another primary care physician has previously obtained the age-appropriate tests and they are up-to-date, only outstanding tests are ordered and referrals given as necessary.  Abnormal results of tests will be discussed with her when all of her results are completed.  Routine preventative health maintenance measures emphasized: Exercise/Diet/Weight control, Tobacco Warnings, Alcohol/Substance use risks and Stress Management Mammogram ordered 2.  Patient to return for skin tag removal when desired. Orders No orders of the defined types were placed in this encounter.   No orders of the defined types were placed in this encounter.         F/U  Return in about 2 weeks (around 06/05/2023).  Delice Felt, M.D. 05/22/2023 10:05 AM

## 2023-05-22 NOTE — Progress Notes (Signed)
 Patients presents for annual exam today. She states doing great, continuing to lose weight and no longer needs BP medication. Complaint of her skin tag still being bothersome, wants it removed in the near future. Up to date on pap smear, mammogram and lab work. She states no other questions or concerns at this time.

## 2023-06-03 ENCOUNTER — Inpatient Hospital Stay: Payer: 59 | Attending: Internal Medicine

## 2023-06-03 DIAGNOSIS — Z9884 Bariatric surgery status: Secondary | ICD-10-CM | POA: Diagnosis not present

## 2023-06-03 DIAGNOSIS — D709 Neutropenia, unspecified: Secondary | ICD-10-CM | POA: Diagnosis present

## 2023-06-03 DIAGNOSIS — D708 Other neutropenia: Secondary | ICD-10-CM

## 2023-06-03 LAB — CMP (CANCER CENTER ONLY)
ALT: 39 U/L (ref 0–44)
AST: 38 U/L (ref 15–41)
Albumin: 4 g/dL (ref 3.5–5.0)
Alkaline Phosphatase: 98 U/L (ref 38–126)
Anion gap: 5 (ref 5–15)
BUN: 12 mg/dL (ref 6–20)
CO2: 26 mmol/L (ref 22–32)
Calcium: 8.5 mg/dL — ABNORMAL LOW (ref 8.9–10.3)
Chloride: 105 mmol/L (ref 98–111)
Creatinine: 0.78 mg/dL (ref 0.44–1.00)
GFR, Estimated: >60 mL/min (ref >60)
Glucose, Bld: 99 mg/dL (ref 70–99)
Potassium: 4.2 mmol/L (ref 3.5–5.1)
Sodium: 136 mmol/L (ref 135–145)
Total Bilirubin: 0.8 mg/dL (ref 0.0–1.2)
Total Protein: 7 g/dL (ref 6.5–8.1)

## 2023-06-03 LAB — CBC WITH DIFFERENTIAL (CANCER CENTER ONLY)
Abs Immature Granulocytes: 0.01 10*3/uL (ref 0.00–0.07)
Basophils Absolute: 0 10*3/uL (ref 0.0–0.1)
Basophils Relative: 0 %
Eosinophils Absolute: 0 10*3/uL (ref 0.0–0.5)
Eosinophils Relative: 1 %
HCT: 40.7 % (ref 36.0–46.0)
Hemoglobin: 13 g/dL (ref 12.0–15.0)
Immature Granulocytes: 0 %
Lymphocytes Relative: 30 %
Lymphs Abs: 1.3 10*3/uL (ref 0.7–4.0)
MCH: 29 pg (ref 26.0–34.0)
MCHC: 31.9 g/dL (ref 30.0–36.0)
MCV: 90.6 fL (ref 80.0–100.0)
Monocytes Absolute: 0.3 10*3/uL (ref 0.1–1.0)
Monocytes Relative: 6 %
Neutro Abs: 2.8 10*3/uL (ref 1.7–7.7)
Neutrophils Relative %: 63 %
Platelet Count: 170 10*3/uL (ref 150–400)
RBC: 4.49 MIL/uL (ref 3.87–5.11)
RDW: 12.6 % (ref 11.5–15.5)
WBC Count: 4.4 10*3/uL (ref 4.0–10.5)
nRBC: 0 % (ref 0.0–0.2)

## 2023-06-05 LAB — ZINC: Zinc: 63 ug/dL (ref 44–115)

## 2023-06-05 LAB — COPPER, SERUM: Copper: 78 ug/dL — ABNORMAL LOW (ref 80–158)

## 2023-06-06 ENCOUNTER — Ambulatory Visit: Payer: 59 | Admitting: Internal Medicine

## 2023-06-12 ENCOUNTER — Encounter: Payer: Self-pay | Admitting: Internal Medicine

## 2023-06-12 ENCOUNTER — Inpatient Hospital Stay: Payer: 59 | Admitting: Internal Medicine

## 2023-06-12 VITALS — BP 148/107 | HR 57 | Temp 97.3°F | Resp 12 | Ht 67.0 in | Wt 195.6 lb

## 2023-06-12 DIAGNOSIS — D708 Other neutropenia: Secondary | ICD-10-CM | POA: Diagnosis not present

## 2023-06-12 DIAGNOSIS — D709 Neutropenia, unspecified: Secondary | ICD-10-CM | POA: Diagnosis not present

## 2023-06-12 NOTE — Assessment & Plan Note (Addendum)
#   Intermittent leukopenia/neutropenia- absolute neutrophil count -1.3 [MAY 2024]; WBC 2.9-3.0;normal hemoglobin/platelets. Patient is asymptomatic-no increased risk of infections.MOST LIKELY benign causes-benign ethnic neutropenia rather than any other causes or malignant causes.  No high risk factors for HIV/hepatitis. As patient is clinically stable recommend holding off any further workup at this time.    # History of gastric bypass [190 pound weight loss]- mild low copper levels- ? Sec to gastric bypass- recommend continue DS-SADI- bariatic MVT.   #Since patient is clinically stable I think is reasonable for the patient to follow-up with PCP/can follow-up with us  as needed.  Patient comfortable with the plan; to call us  if any questions or concerns in the interim.  # DISPOSITION: # follow up as needed- Dr.B

## 2023-06-12 NOTE — Progress Notes (Signed)
 Fever: NO  Night sweats:  YES

## 2023-06-12 NOTE — Progress Notes (Signed)
 [facility] CONSULT NOTE  Patient Care Team: Monique Ano, MD as PCP - General (Family Medicine) Gwyn Leos, MD as Consulting Physician (Oncology)  CHIEF COMPLAINTS/PURPOSE OF CONSULTATION: Leucopenia  # LEUCOPENIA/NEUTROPENIA- ANC; Hb; platelets; CT Ab/US ; Hepatitis/HIV; Alcohol  #  Oncology History   No history exists.   DEC 2022- Sleeve; followed repeat surgery  by [facility] [180 pounds weight loss]  HISTORY OF PRESENTING ILLNESS: Patient ambulating-independently.  Alone.   Charlotte Burgess 48 y.o.  female with history gastric bypass surgery and mild leucopenia/neutropenia is here for follow-up.  Continues to deny any frequent skin infections or sinus or pulmonary infections. She continues to be physically active.  Denies any fatigue or fevers or chills.  Review of Systems  Constitutional:  Negative for chills, diaphoresis, fever, malaise/fatigue and weight loss.  HENT:  Negative for nosebleeds and sore throat.   Eyes:  Negative for double vision.  Respiratory:  Negative for cough, hemoptysis, sputum production, shortness of breath and wheezing.   Cardiovascular:  Negative for chest pain, palpitations, orthopnea and leg swelling.  Gastrointestinal:  Negative for abdominal pain, blood in stool, constipation, diarrhea, heartburn, melena, nausea and vomiting.  Genitourinary:  Negative for dysuria, frequency and urgency.  Musculoskeletal:  Negative for back pain and joint pain.  Skin: Negative.  Negative for itching and rash.  Neurological:  Negative for dizziness, tingling, focal weakness, weakness and headaches.  Endo/Heme/Allergies:  Does not bruise/bleed easily.  Psychiatric/Behavioral:  Negative for depression. The patient is not nervous/anxious and does not have insomnia.      MEDICAL HISTORY:  Past Medical History:  Diagnosis Date   Family history of breast cancer    Hemorrhoids    Hypertension    chronic   Obesity    Ovarian cyst     Vaginal Pap smear, abnormal    ascus/pos    SURGICAL HISTORY: Past Surgical History:  Procedure Laterality Date   COLPOSCOPY W/ BIOPSY / CURETTAGE  10/2011   ecc- neg cin 1 on bx   WRIST SURGERY Left     SOCIAL HISTORY: Social History   Socioeconomic History   Marital status: Single    Spouse name: Not on file   Number of children: Not on file   Years of education: Not on file   Highest education level: Not on file  Occupational History   Not on file  Tobacco Use   Smoking status: Never   Smokeless tobacco: Never  Vaping Use   Vaping status: Never Used  Substance and Sexual Activity   Alcohol use: Yes    Comment: occas   Drug use: No   Sexual activity: Yes    Birth control/protection: Condom  Other Topics Concern   Not on file  Social History Narrative   Not on file   Social Drivers of Health   Financial Resource Strain: Low Risk  (05/08/2023)   Received from [facility]   Overall Financial Resource Strain (CARDIA)    Difficulty of Paying Living Expenses: Not hard at all  Food Insecurity: No Food Insecurity (05/08/2023)   Received from [facility]   Hunger Vital Sign    Worried About Running Out of Food in the Last Year: Never true    Ran Out of Food in the Last Year: Never true  Transportation Needs: No Transportation Needs (05/08/2023)   Received from [facility]   PRAPARE - Transportation    In the past 12 months, has lack  of transportation kept you from medical appointments or from getting medications?: No    Lack of Transportation (Non-Medical): No  Physical Activity: Inactive (01/22/2017)   Exercise Vital Sign    Days of Exercise per Week: 0 days    Minutes of Exercise per Session: 0 min  Stress: Not on file  Social Connections: Not on file  Intimate Partner Violence: Not At Risk (06/07/2022)   Humiliation, Afraid, Rape, and Kick questionnaire    Fear of Current or Ex-Partner: No    Emotionally  Abused: No    Physically Abused: No    Sexually Abused: No    FAMILY HISTORY: Family History  Problem Relation Age of Onset   Heart disease Mother    Heart disease Father    Diabetes Father    Colon cancer Maternal Aunt    Ovarian cancer Maternal Aunt    Breast cancer Maternal Aunt    Breast cancer Maternal Aunt        not 100% sure   Breast cancer Maternal Grandmother     ALLERGIES:  has no known allergies.  MEDICATIONS:  Current Outpatient Medications  Medication Sig Dispense Refill   Multiple Vitamin (MULTI-VITAMINS) TABS Take by mouth.     triamcinolone  ointment (KENALOG ) 0.1 % Apply 1 application topically 2 (two) times daily. 30 g 0   No current facility-administered medications for this visit.    PHYSICAL EXAMINATION:  Vitals:   06/12/23 1021 06/12/23 1029  BP: (!) 152/103 (!) 148/107  Pulse: (!) 57   Resp: 12   Temp: (!) 97.3 F (36.3 C)   SpO2: 100%    Filed Weights   06/12/23 1021  Weight: 195 lb 9.6 oz (88.7 kg)    Physical Exam Vitals and nursing note reviewed.  HENT:     Head: Normocephalic and atraumatic.     Mouth/Throat:     Pharynx: Oropharynx is clear.  Eyes:     Extraocular Movements: Extraocular movements intact.     Pupils: Pupils are equal, round, and reactive to light.  Cardiovascular:     Rate and Rhythm: Normal rate and regular rhythm.  Pulmonary:     Comments: Decreased breath sounds bilaterally.  Abdominal:     Palpations: Abdomen is soft.  Musculoskeletal:        General: Normal range of motion.     Cervical back: Normal range of motion.  Skin:    General: Skin is warm.  Neurological:     General: No focal deficit present.     Mental Status: She is alert and oriented to person, place, and time.  Psychiatric:        Behavior: Behavior normal.        Judgment: Judgment normal.     LABORATORY DATA:  I have reviewed the data as listed Lab Results  Component Value Date   WBC 4.4 06/03/2023   HGB 13.0 06/03/2023    HCT 40.7 06/03/2023   MCV 90.6 06/03/2023   PLT 170 06/03/2023   Recent Labs    12/04/22 0849 06/03/23 1052  NA 138 136  K 4.0 4.2  CL 104 105  CO2 26 26  GLUCOSE 91 99  BUN 11 12  CREATININE 0.79 0.78  CALCIUM 8.9 8.5*  GFRNONAA >60 >60  PROT 7.3 7.0  ALBUMIN 4.1 4.0  AST 64* 38  ALT 50* 39  ALKPHOS 112 98  BILITOT 0.7 0.8    RADIOGRAPHIC STUDIES: I have personally reviewed the radiological images as listed and agreed  with the findings in the report. No results found.  ASSESSMENT & PLAN:   Other neutropenia (HCC) # Intermittent leukopenia/neutropenia- absolute neutrophil count -1.3 [MAY 2024]; WBC 2.9-3.0;normal hemoglobin/platelets. Patient is asymptomatic-no increased risk of infections.MOST LIKELY benign causes-benign ethnic neutropenia rather than any other causes or malignant causes.  No high risk factors for HIV/hepatitis. As patient is clinically stable recommend holding off any further workup at this time.    # History of gastric bypass [190 pound weight loss]- mild low copper levels- ? Sec to gastric bypass- recommend continue DS-SADI- bariatic MVT.   #Since patient is clinically stable I think is reasonable for the patient to follow-up with PCP/can follow-up with us  as needed.  Patient comfortable with the plan; to call us  if any questions or concerns in the interim.  # DISPOSITION: # follow up as needed- Dr.B   All questions were answered. The patient knows to call the clinic with any problems, questions or concerns.    Gwyn Leos, MD 06/12/2023 11:28 AM

## 2023-06-25 ENCOUNTER — Other Ambulatory Visit (HOSPITAL_COMMUNITY)
Admission: RE | Admit: 2023-06-25 | Discharge: 2023-06-25 | Disposition: A | Discharge status: Home / Self Care | Source: Ambulatory Visit | Attending: Obstetrics and Gynecology | Admitting: Obstetrics and Gynecology

## 2023-06-25 ENCOUNTER — Encounter: Payer: Self-pay | Admitting: Obstetrics and Gynecology

## 2023-06-25 ENCOUNTER — Ambulatory Visit: Admitting: Obstetrics and Gynecology

## 2023-06-25 VITALS — BP 129/81 | HR 61 | Ht 67.0 in | Wt 198.0 lb

## 2023-06-25 DIAGNOSIS — N9089 Other specified noninflammatory disorders of vulva and perineum: Secondary | ICD-10-CM | POA: Diagnosis present

## 2023-06-25 NOTE — Progress Notes (Signed)
 HPI:      [name] is a 48 y.o. G1P1001 who LMP was No LMP recorded.  Subjective:   She presents today requesting skin tag removal.  She has had a skin tag on the right labia for approximately 5 years.  She says it has become increasingly bothersome for her and would like it removed.    Hx: The following portions of the patient's history were reviewed and updated as appropriate:             She  has a past medical history of Family history of breast cancer, Hemorrhoids, Hypertension, Obesity, Ovarian cyst, and Vaginal Pap smear, abnormal. She does not have any pertinent problems on file. She  has a past surgical history that includes Colposcopy w/ biopsy / curettage (10/2011) and Wrist surgery (Left). Her family history includes Breast cancer in her maternal aunt, maternal aunt, and maternal grandmother; Colon cancer in her maternal aunt; Diabetes in her father; Heart disease in her father and mother; Ovarian cancer in her maternal aunt. She  reports that she has never smoked. She has never used smokeless tobacco. She reports current alcohol use. She reports that she does not use drugs. She has a current medication list which includes the following prescription(s): multi-vitamins and triamcinolone  ointment. She has no known allergies.       Review of Systems:  Review of Systems  Constitutional: Denied constitutional symptoms, night sweats, recent illness, fatigue, fever, insomnia and weight loss.  Eyes: Denied eye symptoms, eye pain, photophobia, vision change and visual disturbance.  Ears/Nose/Throat/Neck: Denied ear, nose, throat or neck symptoms, hearing loss, nasal discharge, sinus congestion and sore throat.  Cardiovascular: Denied cardiovascular symptoms, arrhythmia, chest pain/pressure, edema, exercise intolerance, orthopnea and palpitations.  Respiratory: Denied pulmonary symptoms, asthma, pleuritic pain, productive sputum, cough, dyspnea and wheezing.  Gastrointestinal:  Denied, gastro-esophageal reflux, melena, nausea and vomiting.  Genitourinary: Denied genitourinary symptoms including symptomatic vaginal discharge, pelvic relaxation issues, and urinary complaints.  Musculoskeletal: Denied musculoskeletal symptoms, stiffness, swelling, muscle weakness and myalgia.  Dermatologic: Denied dermatology symptoms, rash and scar.  Neurologic: Denied neurology symptoms, dizziness, headache, neck pain and syncope.  Psychiatric: Denied psychiatric symptoms, anxiety and depression.  Endocrine: Denied endocrine symptoms including hot flashes and night sweats.   Meds:   Current Outpatient Medications on File Prior to Visit  Medication Sig Dispense Refill   Multiple Vitamin (MULTI-VITAMINS) TABS Take by mouth.     triamcinolone  ointment (KENALOG ) 0.1 % Apply 1 application topically 2 (two) times daily. 30 g 0   No current facility-administered medications on file prior to visit.      Objective:      Vitals:   06/25/23 0905  BP: 129/81  Pulse: 61   Filed Weights   06/25/23 0905  Weight: 198 lb (89.8 kg)              Examination reveals a 4 cm long skin tag on the right labia.  Procedure: Area prepped with Betadine.  Base of lesion injected with lidocaine  with epinephrine.  Using 50 W blend 2 the base of the tag was systematically cut from the labia.  Hemostasis was noted.  Area covered with Dermabond.  She tolerated the procedure well.          Assessment:     G1P1001 Patient Active Problem List   Diagnosis Date Noted   Other neutropenia (HCC) 06/07/2022   Monilia infection 01/22/2017   Morbid obesity with BMI of 60.0-69.9, adult (HCC) 01/22/2017  Essential (primary) hypertension 01/06/2015     1. Labial skin tag     Removed   Plan:            1.  Care of labia discussed.  To follow-up as needed. Orders No orders of the defined types were placed in this encounter.   No orders of the defined types were placed in this encounter.      F/U  No follow-ups on file.  Delice Felt, M.D. 06/25/2023 10:03 AM

## 2023-06-25 NOTE — Progress Notes (Signed)
 Patient presents today for a labial skin tag removal. Reports she has noticed this skin tag for around 5 years and would like it removed.

## 2023-06-27 LAB — SURGICAL PATHOLOGY
